# Patient Record
Sex: Female | Born: 1995 | Race: White | Hispanic: No | Marital: Married | Smoking: Former smoker
Health system: Southern US, Community
[De-identification: ages and names within clinical notes are randomized; demographics above are authoritative.]

## PROBLEM LIST (undated history)

## (undated) DIAGNOSIS — E559 Vitamin D deficiency, unspecified: Secondary | ICD-10-CM

## (undated) DIAGNOSIS — I10 Essential (primary) hypertension: Secondary | ICD-10-CM

## (undated) DIAGNOSIS — J45909 Unspecified asthma, uncomplicated: Secondary | ICD-10-CM

## (undated) DIAGNOSIS — M7989 Other specified soft tissue disorders: Secondary | ICD-10-CM

## (undated) DIAGNOSIS — F419 Anxiety disorder, unspecified: Secondary | ICD-10-CM

## (undated) DIAGNOSIS — R0602 Shortness of breath: Secondary | ICD-10-CM

## (undated) DIAGNOSIS — G4730 Sleep apnea, unspecified: Secondary | ICD-10-CM

## (undated) DIAGNOSIS — F329 Major depressive disorder, single episode, unspecified: Secondary | ICD-10-CM

## (undated) DIAGNOSIS — Z8739 Personal history of other diseases of the musculoskeletal system and connective tissue: Secondary | ICD-10-CM

## (undated) DIAGNOSIS — R51 Headache: Secondary | ICD-10-CM

## (undated) DIAGNOSIS — F32A Depression, unspecified: Secondary | ICD-10-CM

## (undated) DIAGNOSIS — E039 Hypothyroidism, unspecified: Secondary | ICD-10-CM

## (undated) DIAGNOSIS — G8929 Other chronic pain: Secondary | ICD-10-CM

## (undated) DIAGNOSIS — E282 Polycystic ovarian syndrome: Secondary | ICD-10-CM

## (undated) DIAGNOSIS — R519 Headache, unspecified: Secondary | ICD-10-CM

## (undated) DIAGNOSIS — M549 Dorsalgia, unspecified: Secondary | ICD-10-CM

## (undated) HISTORY — DX: Vitamin D deficiency, unspecified: E55.9

## (undated) HISTORY — DX: Depression, unspecified: F32.A

## (undated) HISTORY — DX: Shortness of breath: R06.02

## (undated) HISTORY — DX: Hypothyroidism, unspecified: E03.9

## (undated) HISTORY — DX: Personal history of other diseases of the musculoskeletal system and connective tissue: Z87.39

## (undated) HISTORY — DX: Other chronic pain: G89.29

## (undated) HISTORY — DX: Sleep apnea, unspecified: G47.30

## (undated) HISTORY — DX: Polycystic ovarian syndrome: E28.2

## (undated) HISTORY — DX: Other specified soft tissue disorders: M79.89

## (undated) HISTORY — DX: Dorsalgia, unspecified: M54.9

## (undated) HISTORY — PX: NO PAST SURGERIES: SHX2092

## (undated) HISTORY — DX: Major depressive disorder, single episode, unspecified: F32.9

## (undated) HISTORY — DX: Morbid (severe) obesity due to excess calories: E66.01

## (undated) HISTORY — DX: Unspecified asthma, uncomplicated: J45.909

## (undated) HISTORY — DX: Anxiety disorder, unspecified: F41.9

## (undated) HISTORY — DX: Headache, unspecified: R51.9

## (undated) HISTORY — DX: Essential (primary) hypertension: I10

## (undated) HISTORY — PX: OTHER SURGICAL HISTORY: SHX169

## (undated) HISTORY — DX: Headache: R51

---

## 2014-10-15 DIAGNOSIS — I10 Essential (primary) hypertension: Secondary | ICD-10-CM | POA: Insufficient documentation

## 2014-10-15 HISTORY — DX: Essential (primary) hypertension: I10

## 2014-12-22 DIAGNOSIS — K219 Gastro-esophageal reflux disease without esophagitis: Secondary | ICD-10-CM | POA: Insufficient documentation

## 2014-12-22 HISTORY — DX: Gastro-esophageal reflux disease without esophagitis: K21.9

## 2016-06-07 DIAGNOSIS — F322 Major depressive disorder, single episode, severe without psychotic features: Secondary | ICD-10-CM

## 2016-06-07 DIAGNOSIS — F419 Anxiety disorder, unspecified: Secondary | ICD-10-CM | POA: Insufficient documentation

## 2016-06-07 HISTORY — DX: Major depressive disorder, single episode, severe without psychotic features: F32.2

## 2018-01-29 ENCOUNTER — Other Ambulatory Visit: Payer: Self-pay | Admitting: Family

## 2018-01-29 ENCOUNTER — Ambulatory Visit (INDEPENDENT_AMBULATORY_CARE_PROVIDER_SITE_OTHER): Payer: PRIVATE HEALTH INSURANCE | Admitting: Family

## 2018-01-29 ENCOUNTER — Encounter: Payer: Self-pay | Admitting: Family

## 2018-01-29 VITALS — BP 128/73 | HR 79 | Temp 98.3°F | Resp 16 | Ht 68.0 in | Wt >= 6400 oz

## 2018-01-29 DIAGNOSIS — J4520 Mild intermittent asthma, uncomplicated: Secondary | ICD-10-CM

## 2018-01-29 DIAGNOSIS — Z6841 Body Mass Index (BMI) 40.0 and over, adult: Secondary | ICD-10-CM

## 2018-01-29 DIAGNOSIS — F32A Depression, unspecified: Secondary | ICD-10-CM

## 2018-01-29 DIAGNOSIS — E039 Hypothyroidism, unspecified: Secondary | ICD-10-CM

## 2018-01-29 DIAGNOSIS — F329 Major depressive disorder, single episode, unspecified: Secondary | ICD-10-CM

## 2018-01-29 DIAGNOSIS — R51 Headache: Secondary | ICD-10-CM

## 2018-01-29 DIAGNOSIS — G8929 Other chronic pain: Secondary | ICD-10-CM

## 2018-01-29 DIAGNOSIS — R350 Frequency of micturition: Secondary | ICD-10-CM

## 2018-01-29 DIAGNOSIS — G4733 Obstructive sleep apnea (adult) (pediatric): Secondary | ICD-10-CM | POA: Diagnosis not present

## 2018-01-29 LAB — URINALYSIS, ROUTINE W REFLEX MICROSCOPIC
BILIRUBIN URINE: NEGATIVE
KETONES UR: NEGATIVE
Leukocytes, UA: NEGATIVE
NITRITE: NEGATIVE
Specific Gravity, Urine: 1.02 (ref 1.000–1.030)
Urine Glucose: NEGATIVE
Urobilinogen, UA: 0.2 (ref 0.0–1.0)
pH: 6.5 (ref 5.0–8.0)

## 2018-01-29 LAB — HEMOGLOBIN A1C: HEMOGLOBIN A1C: 5.6 % (ref 4.6–6.5)

## 2018-01-29 LAB — BASIC METABOLIC PANEL
BUN: 8 mg/dL (ref 6–23)
CALCIUM: 9.1 mg/dL (ref 8.4–10.5)
CO2: 26 mEq/L (ref 19–32)
Chloride: 107 mEq/L (ref 96–112)
Creatinine, Ser: 0.56 mg/dL (ref 0.40–1.20)
GFR: 141.94 mL/min (ref >60.00)
Glucose, Bld: 97 mg/dL (ref 70–99)
POTASSIUM: 4.1 meq/L (ref 3.5–5.1)
Sodium: 140 mEq/L (ref 135–145)

## 2018-01-29 LAB — TSH: TSH: 2.54 u[IU]/mL (ref 0.35–4.50)

## 2018-01-29 LAB — T4, FREE: Free T4: 0.89 ng/dL (ref 0.60–1.60)

## 2018-01-29 LAB — T3, FREE: T3, Free: 4 pg/mL (ref 2.3–4.2)

## 2018-01-29 NOTE — Patient Instructions (Addendum)
You should be contacted about scheduling your appointment with neurology to further evaluate your headaches.

## 2018-01-29 NOTE — Telephone Encounter (Signed)
Lab work suggest urinary tract infection.  I would like her to take Macrodantin.  Also there was blood in her urine today.  Did she have her period today?  If not we will need to repeat her urinalysis with microscopic in 2 weeks please diagnosis hematuria.  Prescription has been pended.  Where would she like it sent?

## 2018-01-29 NOTE — Progress Notes (Signed)
Subjective:    Patient ID: Tiffany Serrano, female    DOB: [birth year], 22 y.o.   MRN: [id number]  HPI   Patient is a 22 yr old female who presents today to establish care.   Pmhx is significant for the following:  OSA-reports that she was diagnosed last year.  She uses CPAP and this is being manage [facility]  Hypothyroid- reports that she was on levothyroxine in the past.  Last February it was d/c'd because labs look better.   HTN- reports that she has never been treated with meds.   BP Readings from Last 3 Encounters:  01/29/18 128/73   Depression- reports that she lost her dad at age 5.  Mom remarried and she did not have a good relationship with her stepdad. 2017.  Was hospitalized at [facility] 2017 and treated with prozac which was discontinued May of last year.  Reports that mood has its ups and downs.  Feels well currently.   Chronic Headaches- occur 1-2 times a day. Last 30-60 minutes. Starts in the back of her neck and radiates up her head.  Uses excedrin prn. Has occasional associated dizziness. Denies associated nausea.  Reports good compliance with cpap.  Less headaches when she is off.    Asthma- she reports that she had asthma as a child. If she is around cigarette smoke.  Has an albuterol inhaler as needed.   Obesity- Reports that when she graduated high school she weighed about 350lbs.  Has tried multiple diets. She reports that she lost about 30 pounds but  Has a microwave and crockpot.  Has a hot plate  Wt Readings from Last 3 Encounters:  01/29/18 (!) 430 lb (195 kg)    Review of Systems  Constitutional: Negative for unexpected weight change.  HENT: Negative for hearing loss and rhinorrhea.   Eyes: Negative for visual disturbance.  Respiratory: Negative for cough.   Cardiovascular:       Has occasional LE edema  Gastrointestinal: Negative for constipation and diarrhea.  Genitourinary: Positive for frequency. Negative for dysuria.       Irregular  menses  Musculoskeletal: Negative for myalgias.       Some occasional right knee pain  Skin: Negative for rash.  Hematological: Negative for adenopathy.  Psychiatric/Behavioral:       Denies current depression symptoms.    Past Medical History:  Diagnosis Date  . Asthma   . Chronic headaches   . Depression   . Hypertension   . Hypothyroidism   . Sleep apnea      Social History   Socioeconomic History  . Marital status: Single    Spouse name: Not on file  . Number of children: Not on file  . Years of education: Not on file  . Highest education level: Not on file  Occupational History  . Occupation: Runner, broadcasting/film/video  . Financial resource strain: Not hard at all  . Food insecurity:    Worry: Never true    Inability: Never true  . Transportation needs:    Medical: No    Non-medical: No  Tobacco Use  . Smoking status: Former Smoker    Types: Cigarettes    Last attempt to quit: 01/29/2013    Years since quitting: 5.0  . Smokeless tobacco: Never Used  Substance and Sexual Activity  . Alcohol use: Yes    Comment: "some times"  . Drug use: Never  . Sexual activity: Yes    Partners: Male  Lifestyle  . Physical activity:    Days per week: 7 days    Minutes per session: 30 min  . Stress: Not on file  Relationships  . Social connections:    Talks on phone: More than three times a week    Gets together: Never    Attends religious service: More than 4 times per year    Active member of club or organization: No    Attends meetings of clubs or organizations: Never    Relationship status: Never married  . Intimate partner violence:    Fear of current or ex partner: Not on file    Emotionally abused: Not on file    Physically abused: Not on file    Forced sexual activity: Not on file  Other Topics Concern  . Not on file  Social History Narrative   Originally from [place], [place].   Truck driver (long distance)   Lives with her Mom and mom's fiance   Has a dog     Enjoys relaxing in her free time, spending time with family   Completed college    Past Surgical History:  Procedure Laterality Date  . none      Family History  Problem Relation Age of Onset  . Arthritis Mother   . Pancreatic cancer Father   . Anxiety disorder Sister   . Arthritis Maternal Grandmother   . Stroke Maternal Grandmother   . Arthritis Maternal Grandfather   . Arthritis Paternal Grandmother   . Arthritis Paternal Grandfather   . Heart attack Paternal Grandfather   . Hypertension Paternal Grandfather     No Known Allergies  Current Outpatient Medications on File Prior to Visit  Medication Sig Dispense Refill  . albuterol (PROVENTIL HFA;VENTOLIN HFA) 108 (90 Base) MCG/ACT inhaler Inhale 2 puffs into the lungs every 6 (six) hours as needed for wheezing or shortness of breath.     No current facility-administered medications on file prior to visit.     BP 128/73 (BP Location: Right Arm, Patient Position: Sitting, Cuff Size: Large)   Pulse 79   Temp 98.3 F (36.8 C) (Oral)   Resp 16   Ht 5\' 8"  (1.727 m)   Wt (!) 430 lb (195 kg)   LMP 01/15/2018   SpO2 100%   BMI 65.38 kg/m       Objective:   Physical Exam  Constitutional: She is oriented to person, place, and time. She appears well-developed. No distress.  Morbidly obese white female  HENT:  Head: Normocephalic and atraumatic.  Eyes: Pupils are equal, round, and reactive to light. Right eye exhibits no discharge. Left eye exhibits no discharge. No scleral icterus.  Neck: Neck supple. No thyromegaly present.  Cardiovascular: Normal rate and regular rhythm.  No murmur heard. Pulmonary/Chest: Effort normal. No stridor. No respiratory distress. She has no wheezes.  Musculoskeletal: She exhibits no edema.  Lymphadenopathy:    She has no cervical adenopathy.  Neurological: She is alert and oriented to person, place, and time. Coordination normal.  Skin: Skin is warm and dry.  + facial hair noted on  neck.   Psychiatric: She has a normal mood and affect. Her behavior is normal. Judgment and thought content normal.          Assessment & Plan:  Obstructive sleep apnea-patient reports good compliance with CPAP.  This is being managed by a sleep specialist.  Morbid obesity-we discussed healthy diet and exercise.  Hypothyroid-obtain follow-up thyroid function studies.  Clinically stable.  Chronic headaches-  uncontrolled.  Will refer to neurology for further evaluation.  Asthma- reports well-controlled typically only uses albuterol if she has bronchitis.  Monitor.  Depression-she reports that her depression symptoms are currently well controlled.  We will continue to monitor.

## 2018-01-30 ENCOUNTER — Encounter: Payer: Self-pay | Admitting: Neurology

## 2018-01-30 ENCOUNTER — Other Ambulatory Visit: Payer: Self-pay

## 2018-01-30 DIAGNOSIS — R350 Frequency of micturition: Secondary | ICD-10-CM | POA: Insufficient documentation

## 2018-01-30 DIAGNOSIS — G4733 Obstructive sleep apnea (adult) (pediatric): Secondary | ICD-10-CM | POA: Insufficient documentation

## 2018-01-30 DIAGNOSIS — J4520 Mild intermittent asthma, uncomplicated: Secondary | ICD-10-CM

## 2018-01-30 DIAGNOSIS — G8929 Other chronic pain: Secondary | ICD-10-CM | POA: Insufficient documentation

## 2018-01-30 DIAGNOSIS — E039 Hypothyroidism, unspecified: Secondary | ICD-10-CM | POA: Insufficient documentation

## 2018-01-30 DIAGNOSIS — Z6841 Body Mass Index (BMI) 40.0 and over, adult: Secondary | ICD-10-CM

## 2018-01-30 DIAGNOSIS — R519 Headache, unspecified: Secondary | ICD-10-CM | POA: Insufficient documentation

## 2018-01-30 DIAGNOSIS — F32A Depression, unspecified: Secondary | ICD-10-CM | POA: Insufficient documentation

## 2018-01-30 DIAGNOSIS — R51 Headache: Secondary | ICD-10-CM

## 2018-01-30 DIAGNOSIS — F329 Major depressive disorder, single episode, unspecified: Secondary | ICD-10-CM | POA: Insufficient documentation

## 2018-01-30 HISTORY — DX: Obstructive sleep apnea (adult) (pediatric): G47.33

## 2018-01-30 HISTORY — DX: Mild intermittent asthma, uncomplicated: J45.20

## 2018-01-30 MED ORDER — NITROFURANTOIN MACROCRYSTAL 100 MG PO CAPS: 100.0000 mg | ORAL_CAPSULE | Freq: Two times a day (BID) | ORAL | 0 refills | Status: DC

## 2018-01-30 NOTE — Telephone Encounter (Signed)
Results given to patient, she reports she did have her period during specimen collection. [place] [facility] [place] per Melissa not necessary to f/up on microhematuria.

## 2018-02-19 ENCOUNTER — Encounter: Payer: Self-pay | Admitting: Family

## 2018-04-09 NOTE — Progress Notes (Deleted)
NEUROLOGY CONSULTATION NOTE  Tiffany Serrano MRN: [id number] DOB: 1995/11/07  Referring provider: Sandford Craze, NP Primary care provider: Sandford Craze, NP  Reason for consult:  headache  HISTORY OF PRESENT ILLNESS: Tiffany Serrano is a 22 year old ***-handed female with depression, hypertension, hypothyroidism and sleep apnea who presents for headaches.  History supplemented by referring providers note.  Onset:  *** Location:  Starts in back of neck and radiates up to *** Quality:  *** Intensity:  ***.  *** denies new headache, thunderclap headache or severe headache that wakes *** from sleep. Aura:  *** Prodrome:  *** Postdrome:  *** Associated symptoms:  ***.  She denies associated nausea, vomiting, *** unilateral numbness or weakness. Duration:  30-60 minutes Frequency:  1 to 2 times daily Frequency of abortive medication: *** Triggers:  *** Exacerbating factors:  *** Relieving factors:  *** Activity:  ***  Current NSAIDS:  *** Current analgesics:  Excedrin Current triptans:  *** Current ergotamine:  *** Current anti-emetic:  *** Current muscle relaxants:  *** Current anti-anxiolytic:  *** Current sleep aide:  *** Current Antihypertensive medications:  *** Current Antidepressant medications:  *** Current Anticonvulsant medications:  *** Current anti-CGRP:  *** Current Vitamins/Herbal/Supplements:  *** Current Antihistamines/Decongestants:  *** Other therapy:  *** Other medication:  ***  Past NSAIDS:  *** Past analgesics:  *** Past abortive triptans:  *** Past abortive ergotamine:  *** Past muscle relaxants:  *** Past anti-emetic:  *** Past antihypertensive medications:  *** Past antidepressant medications:  *** Past anticonvulsant medications:  *** Past anti-CGRP:  *** Past vitamins/Herbal/Supplements:  *** Past antihistamines/decongestants:  *** Other past therapies:  ***  Caffeine:  *** Alcohol:  *** Smoker:  *** Diet:  *** Exercise:   *** Depression:  ***; Anxiety:  *** Other pain:  *** Sleep hygiene:  Good.  Has OSA but compliant with CPAP Family history of headache:  ***  01/29/18 LABS:  BMP with Na 140, K 4.1, CO2 26, glucose 97, BUN 8, Cr 0.56  PAST MEDICAL HISTORY: Past Medical History:  Diagnosis Date  . Asthma   . Chronic headaches   . Depression   . Hypertension   . Hypothyroidism   . Sleep apnea     PAST SURGICAL HISTORY: Past Surgical History:  Procedure Laterality Date  . none      MEDICATIONS: Current Outpatient Medications on File Prior to Visit  Medication Sig Dispense Refill  . albuterol (PROVENTIL HFA;VENTOLIN HFA) 108 (90 Base) MCG/ACT inhaler Inhale 2 puffs into the lungs every 6 (six) hours as needed for wheezing or shortness of breath.    . nitrofurantoin (MACRODANTIN) 100 MG capsule Take 1 capsule (100 mg total) by mouth 2 (two) times daily. 10 capsule 0   No current facility-administered medications on file prior to visit.     ALLERGIES: No Known Allergies  FAMILY HISTORY: Family History  Problem Relation Age of Onset  . Arthritis Mother   . Pancreatic cancer Father   . Anxiety disorder Sister   . Arthritis Maternal Grandmother   . Stroke Maternal Grandmother   . Arthritis Maternal Grandfather   . Arthritis Paternal Grandmother   . Arthritis Paternal Grandfather   . Heart attack Paternal Grandfather   . Hypertension Paternal Grandfather    ***.  SOCIAL HISTORY: Social History   Socioeconomic History  . Marital status: Single    Spouse name: Not on file  . Number of children: Not on file  . Years of education: Not on file  .  Highest education level: Not on file  Occupational History  . Occupation: Runner, broadcasting/film/video  . Financial resource strain: Not hard at all  . Food insecurity:    Worry: Never true    Inability: Never true  . Transportation needs:    Medical: No    Non-medical: No  Tobacco Use  . Smoking status: Former Smoker    Types: Cigarettes     Last attempt to quit: 01/29/2013    Years since quitting: 5.1  . Smokeless tobacco: Never Used  Substance and Sexual Activity  . Alcohol use: Yes    Comment: "some times"  . Drug use: Never  . Sexual activity: Yes    Partners: Male  Lifestyle  . Physical activity:    Days per week: 7 days    Minutes per session: 30 min  . Stress: Not on file  Relationships  . Social connections:    Talks on phone: More than three times a week    Gets together: Never    Attends religious service: More than 4 times per year    Active member of club or organization: No    Attends meetings of clubs or organizations: Never    Relationship status: Never married  . Intimate partner violence:    Fear of current or ex partner: Not on file    Emotionally abused: Not on file    Physically abused: Not on file    Forced sexual activity: Not on file  Other Topics Concern  . Not on file  Social History Narrative   Originally from [place], [place].   Truck driver (long distance)   Lives with her Mom and mom's fiance   Has a dog   Enjoys relaxing in her free time, spending time with family   Completed college    REVIEW OF SYSTEMS: Constitutional: No fevers, chills, or sweats, no generalized fatigue, change in appetite Eyes: No visual changes, double vision, eye pain Ear, nose and throat: No hearing loss, ear pain, nasal congestion, sore throat Cardiovascular: No chest pain, palpitations Respiratory:  No shortness of breath at rest or with exertion, wheezes GastrointestinaI: No nausea, vomiting, diarrhea, abdominal pain, fecal incontinence Genitourinary:  No dysuria, urinary retention or frequency Musculoskeletal:  No neck pain, back pain Integumentary: No rash, pruritus, skin lesions Neurological: as above Psychiatric: No depression, insomnia, anxiety Endocrine: No palpitations, fatigue, diaphoresis, mood swings, change in appetite, change in weight, increased thirst Hematologic/Lymphatic:  No  purpura, petechiae. Allergic/Immunologic: no itchy/runny eyes, nasal congestion, recent allergic reactions, rashes  PHYSICAL EXAM: *** General: No acute distress.  Patient appears ***-groomed.  *** Head:  Normocephalic/atraumatic Eyes:  fundi examined but not visualized Neck: supple, no paraspinal tenderness, full range of motion Back: No paraspinal tenderness Heart: regular rate and rhythm Lungs: Clear to auscultation bilaterally. Vascular: No carotid bruits. Neurological Exam: Mental status: alert and oriented to person, place, and time, recent and remote memory intact, fund of knowledge intact, attention and concentration intact, speech fluent and not dysarthric, language intact. Cranial nerves: CN I: not tested CN II: pupils equal, round and reactive to light, visual fields intact CN III, IV, VI:  full range of motion, no nystagmus, no ptosis CN V: facial sensation intact CN VII: upper and lower face symmetric CN VIII: hearing intact CN IX, X: gag intact, uvula midline CN XI: sternocleidomastoid and trapezius muscles intact CN XII: tongue midline Bulk & Tone: normal, no fasciculations. Motor:  5/5 throughout *** Sensation:  Pinprick *** temperature ***  and vibration sensation intact.  ***. Deep Tendon Reflexes:  2+ throughout, *** toes downgoing.  *** Finger to nose testing:  Without dysmetria.  *** Heel to shin:  Without dysmetria.  *** Gait:  Normal station and stride.  Able to turn and tandem walk. Romberg ***.  IMPRESSION: ***  PLAN: ***  Thank you for allowing me to take part in the care of this patient.  Shon MilletAdam Aundrey Elahi, DO  CC: ***

## 2018-04-10 ENCOUNTER — Ambulatory Visit: Payer: PRIVATE HEALTH INSURANCE | Admitting: Family

## 2018-04-10 ENCOUNTER — Ambulatory Visit: Payer: PRIVATE HEALTH INSURANCE | Admitting: Neurology

## 2018-04-12 ENCOUNTER — Ambulatory Visit: Payer: PRIVATE HEALTH INSURANCE | Admitting: Family

## 2021-11-04 ENCOUNTER — Other Ambulatory Visit: Payer: Self-pay

## 2021-11-04 DIAGNOSIS — R1900 Intra-abdominal and pelvic swelling, mass and lump, unspecified site: Secondary | ICD-10-CM

## 2021-11-07 ENCOUNTER — Other Ambulatory Visit: Payer: Self-pay

## 2021-11-07 DIAGNOSIS — R1900 Intra-abdominal and pelvic swelling, mass and lump, unspecified site: Secondary | ICD-10-CM

## 2021-11-15 ENCOUNTER — Encounter (INDEPENDENT_AMBULATORY_CARE_PROVIDER_SITE_OTHER): Payer: Self-pay

## 2021-11-15 DIAGNOSIS — Z0289 Encounter for other administrative examinations: Secondary | ICD-10-CM

## 2021-11-23 ENCOUNTER — Ambulatory Visit
Admission: RE | Admit: 2021-11-23 | Discharge: 2021-11-23 | Disposition: A | Discharge status: Home / Self Care | Payer: BC Managed Care – PPO | Source: Ambulatory Visit

## 2021-11-23 ENCOUNTER — Other Ambulatory Visit: Payer: Self-pay

## 2021-11-23 ENCOUNTER — Ambulatory Visit
Admission: RE | Admit: 2021-11-23 | Discharge: 2021-11-23 | Disposition: A | Discharge status: Home / Self Care | Payer: PRIVATE HEALTH INSURANCE | Source: Ambulatory Visit

## 2021-11-23 DIAGNOSIS — R1900 Intra-abdominal and pelvic swelling, mass and lump, unspecified site: Secondary | ICD-10-CM

## 2021-11-23 IMAGING — MR MR ABDOMEN W/O CM
5 series · 33 of 48 positions shown · non-contrast
Comparison: Noncontrast CT on [DATE]

CLINICAL DATA: Cystic abdominal and pelvic mass on recent CT.

EXAM:
MRI ABDOMEN AND PELVIS WITHOUT CONTRAST
TECHNIQUE: Multiplanar multisequence MR imaging of the abdomen and pelvis was
performed. No intravenous contrast was administered. Exam was
technically limited by patient's large habitus and inability to
complete all exam sequences.

[Series 5: T2 · coronal · 5.0mm · 1.95mm/px · 6 of 42 slices shown (1 of 3)]
[im 1/42]
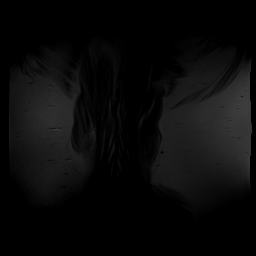
[im 9/42]
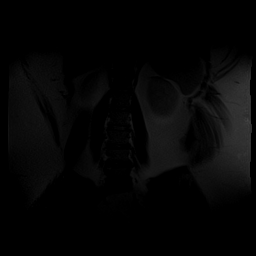
[im 17/42]
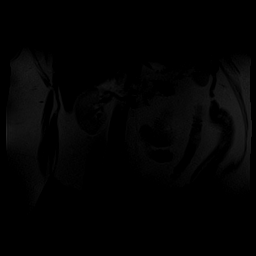
[im 25/42]
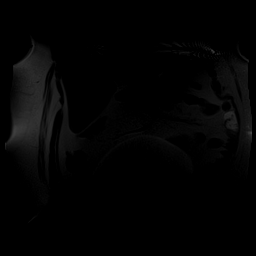
[im 33/42]
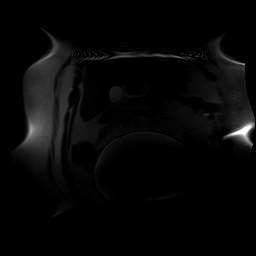
[im 42/42]
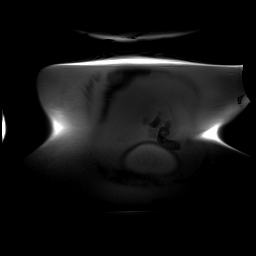

[Series 6: T1 · axial · 4.0mm · 1.38mm/px · z∈[-264,-4]mm · 11 of 144 slices shown]
[im 7/144]
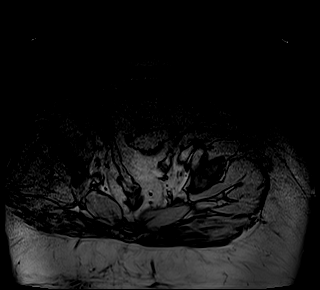
[im 20/144]
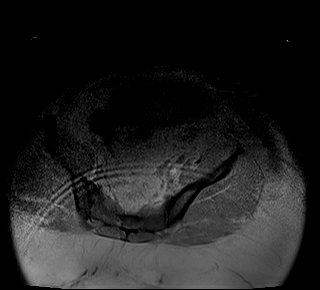
[im 27/144]
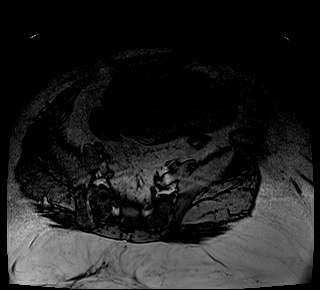
[im 46/144]
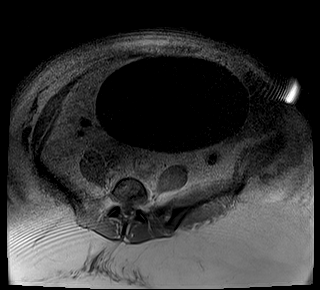
[im 66/144]
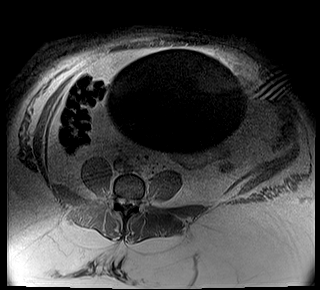
[im 72/144]
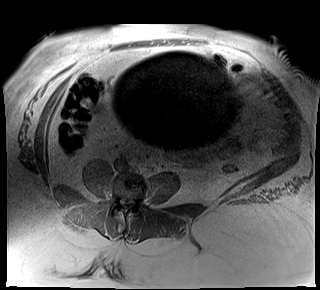
[im 79/144]
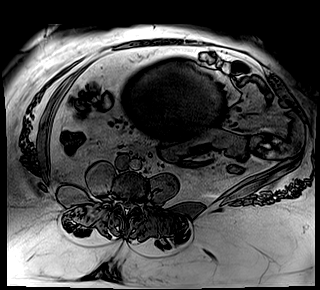
[im 98/144]
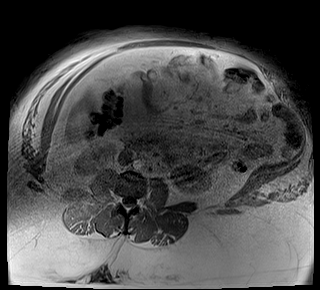
[im 118/144]
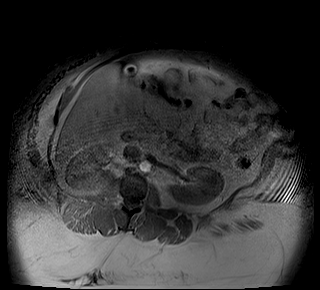
[im 124/144]
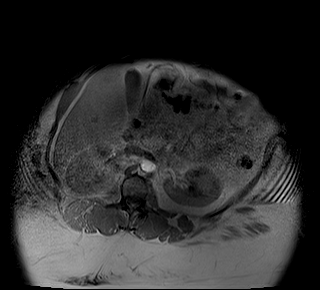
[im 137/144]
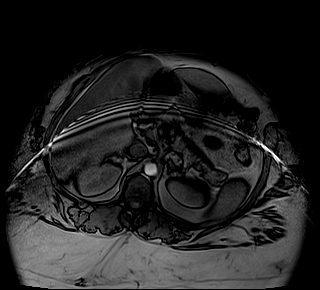

[Series 10: T2 · coronal · 6.0mm · 1.56mm/px · 5 of 33 slices shown (2 of 3)]
[im 1/33]
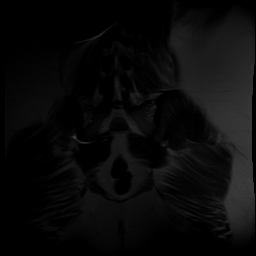
[im 9/33]
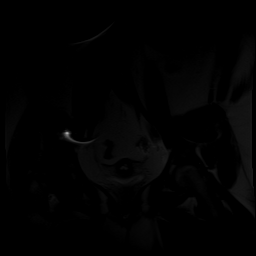
[im 17/33]
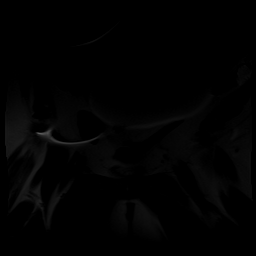
[im 25/33]
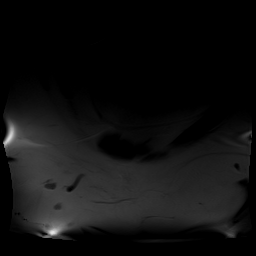
[im 33/33]
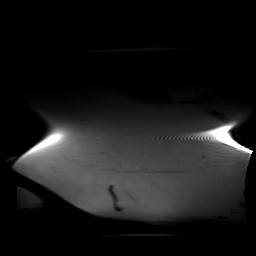

[Series 11: T2 · axial · 5.5mm · 0.70mm/px · z∈[-36,+241]mm · 7 of 43 slices shown (3 of 3)]
[im 1/43]
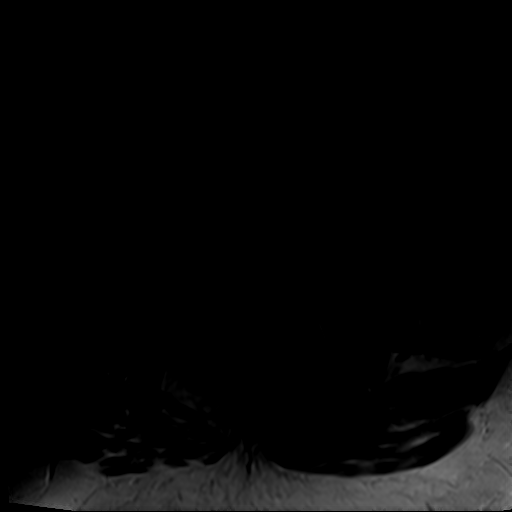
[im 8/43]
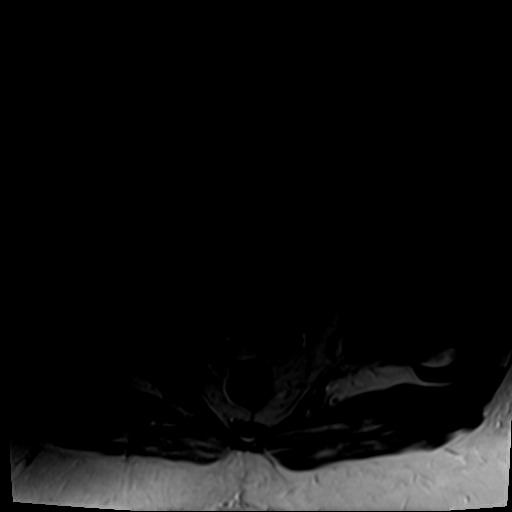
[im 15/43]
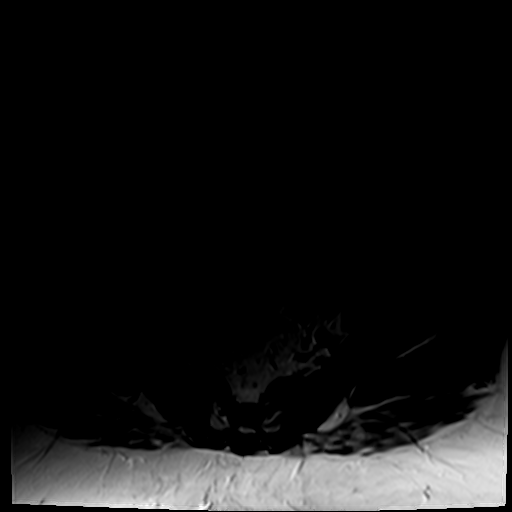
[im 22/43]
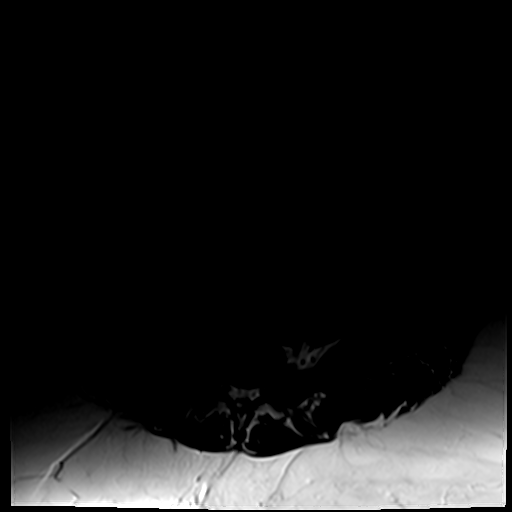
[im 29/43]
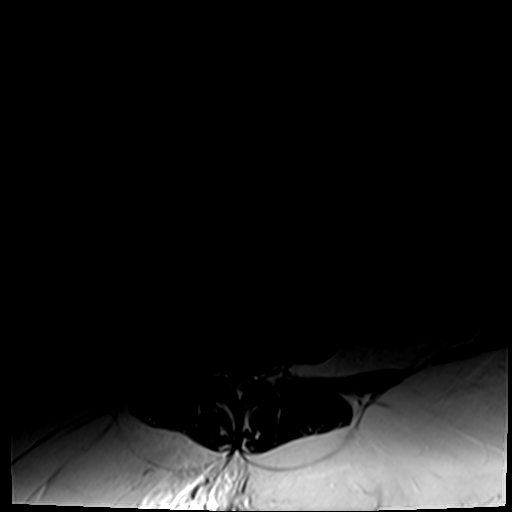
[im 36/43]
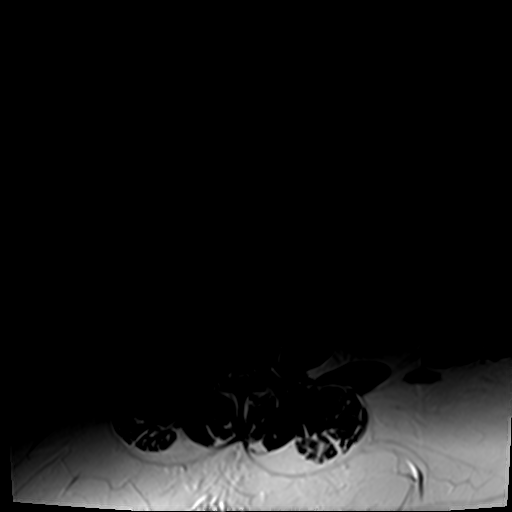
[im 43/43]
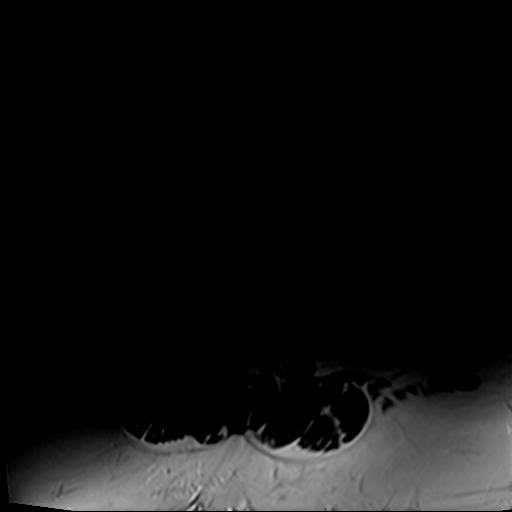

[Series 12: T2 fat-sat · axial · 5.5mm · 0.70mm/px · z∈[-36,+103]mm · 4 of 43 slices shown]
[im 1/43]
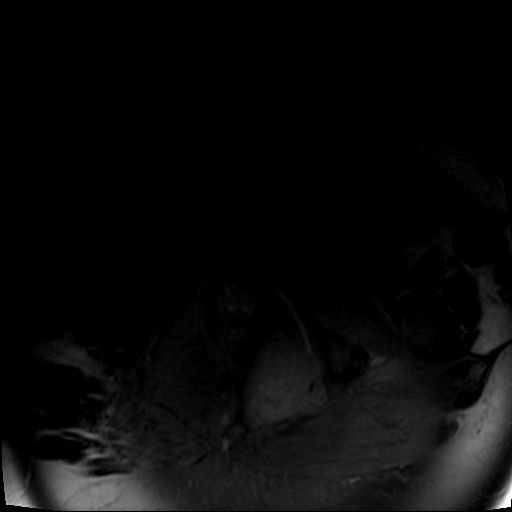
[im 8/43]
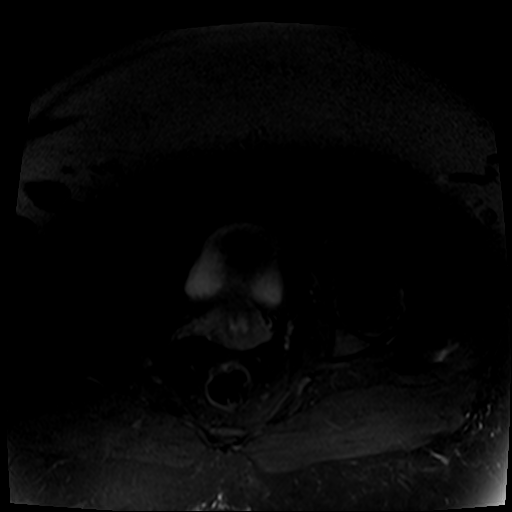
[im 15/43]
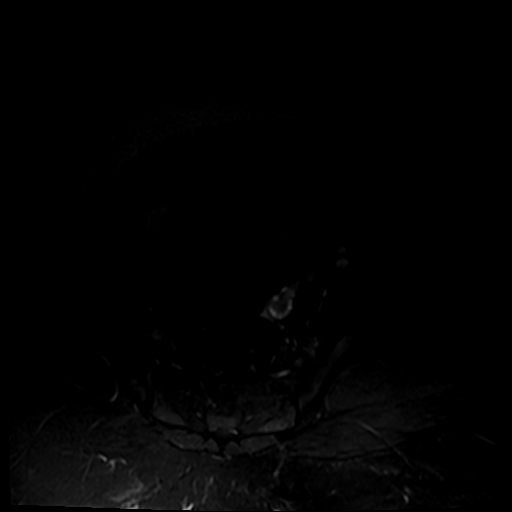
[im 22/43]
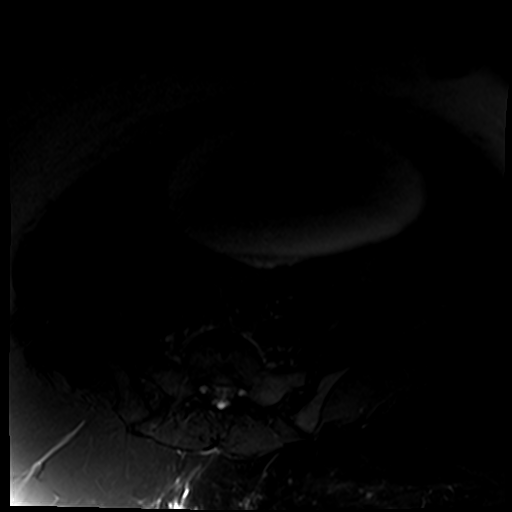

[33 of 48 positions shown; findings below may reference images not displayed]

FINDINGS: COMBINED FINDINGS FOR BOTH MR ABDOMEN AND PELVIS

Hepatobiliary:  No mass visualized on this unenhanced exam.

Pancreas: No mass or inflammatory process visualized on this
unenhanced exam.

Spleen:  Within normal limits in size.

Adrenals/Urinary tract: No evidence of urolithiasis or
hydronephrosis. Unremarkable unopacified urinary bladder.

Stomach/Bowel: No evidence of obstruction, inflammatory process, or
abnormal fluid collections.

Vascular/Lymphatic: No pathologically enlarged lymph nodes
identified. No evidence of abdominal aortic aneurysm.

Reproductive: The uterus is normal in size and appearance. The left
ovary is normal. A large simple appearing cystic lesion is seen in
the central upper pelvis and lower abdomen, which appears to arise
from the right ovary is normal ovarian tissue is seen along the
posterior margin of this lesion. This lesion measures 20.9 x 14.5 x
14.6 cm, and shows no evidence of internal septations, mural
nodules, or other solid component. This is highly suspicious for
ovarian serous cystadenoma. No other pelvic masses identified. No
evidence of free fluid.

Other:  None.

Musculoskeletal:  No suspicious bone lesions identified.
IMPRESSION: Technically limited exam. 21 cm simple appearing cystic lesion in
the central upper pelvis and lower abdomen, which appears to arise
from the right ovary. This has benign characteristics, and is highly
suspicious for ovarian serous cystadenoma. Surgical removal should
be considered.

## 2021-11-23 IMAGING — MR MR PELVIS W/O CM
3 series · 25 of 48 positions shown · non-contrast
Comparison: CT abdomen/pelvis dated [DATE]

CLINICAL DATA: Abdominopelvic mass on CT

EXAM:
MRI PELVIS WITHOUT CONTRAST
TECHNIQUE: Multiplanar multisequence MR imaging of the pelvis was performed. No
intravenous contrast was administered.

[Series 9: T2 · coronal · 6.0mm · 1.56mm/px · 8 of 33 slices shown (1 of 2)]
[im 1/33]
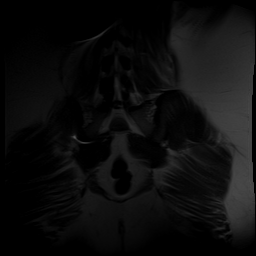
[im 5/33]
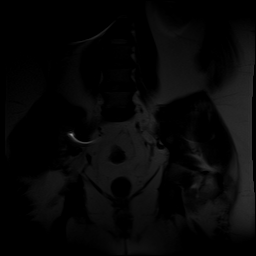
[im 10/33]
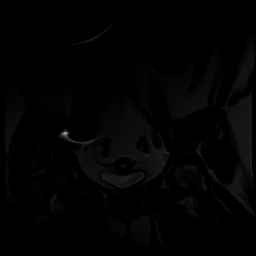
[im 15/33]
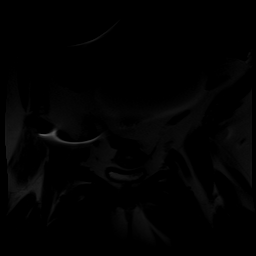
[im 18/33]
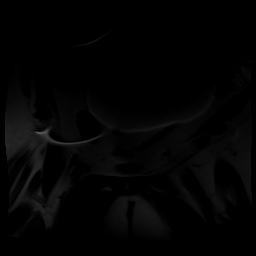
[im 23/33]
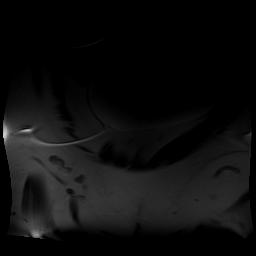
[im 28/33]
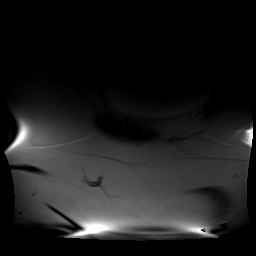
[im 33/33]
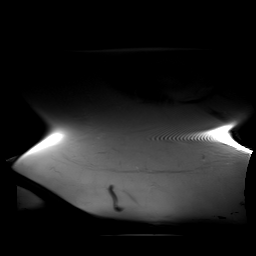

[Series 10: T2 · axial · 5.5mm · 0.70mm/px · z∈[-23,+221]mm · 11 of 43 slices shown (2 of 2)]
[im 3/43]
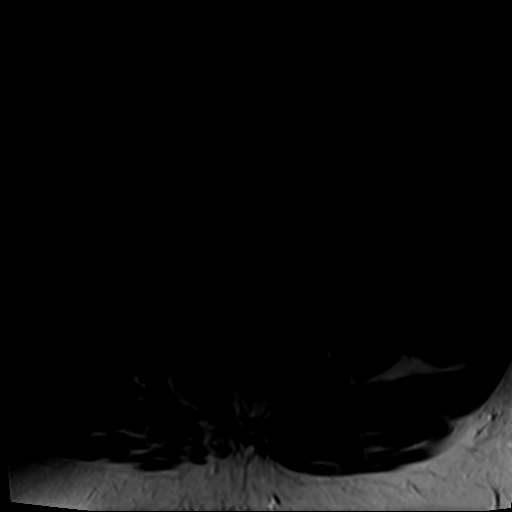
[im 6/43]
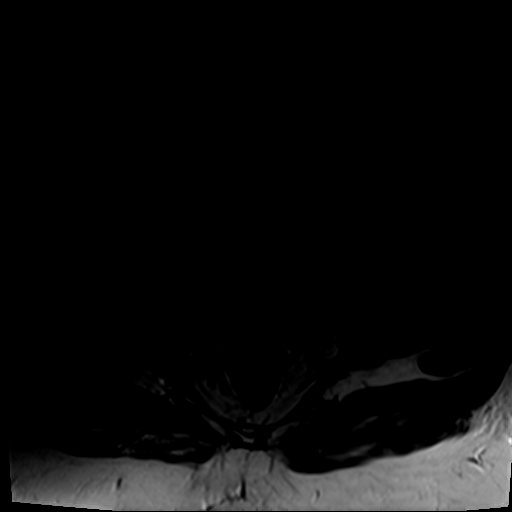
[im 8/43]
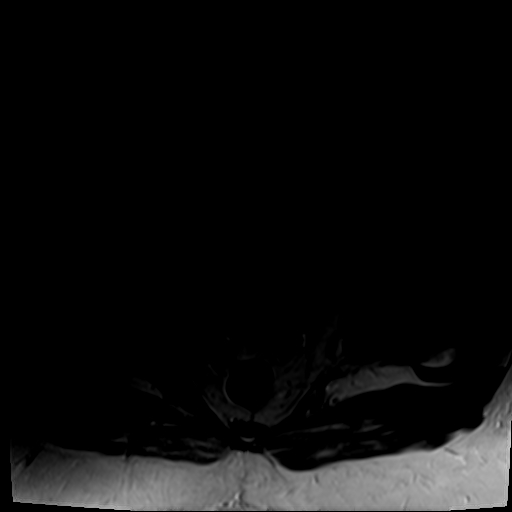
[im 14/43]
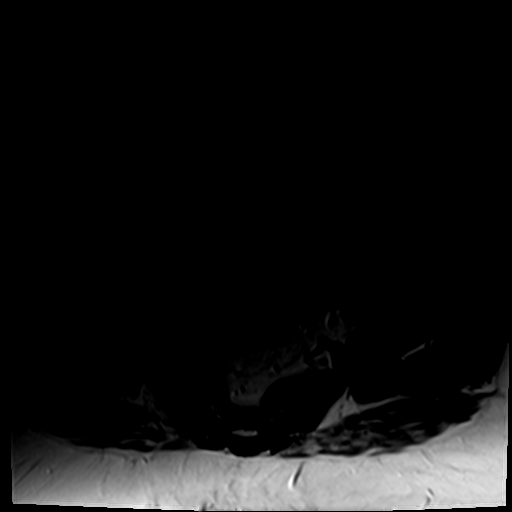
[im 19/43]
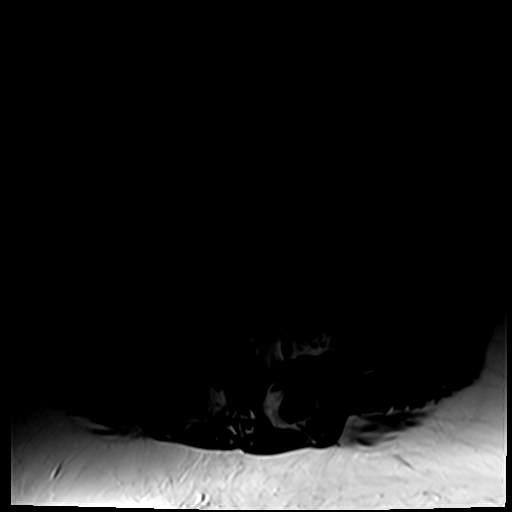
[im 22/43]
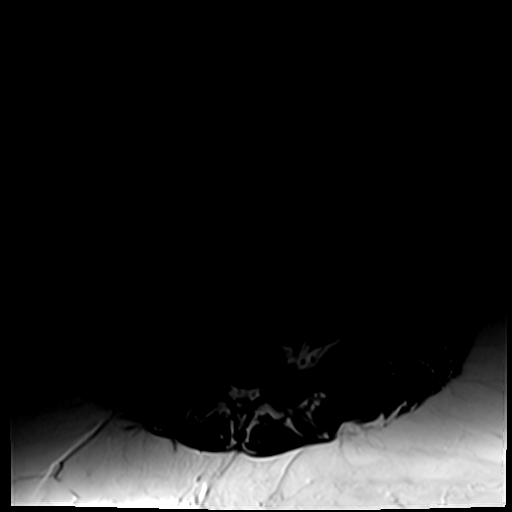
[im 24/43]
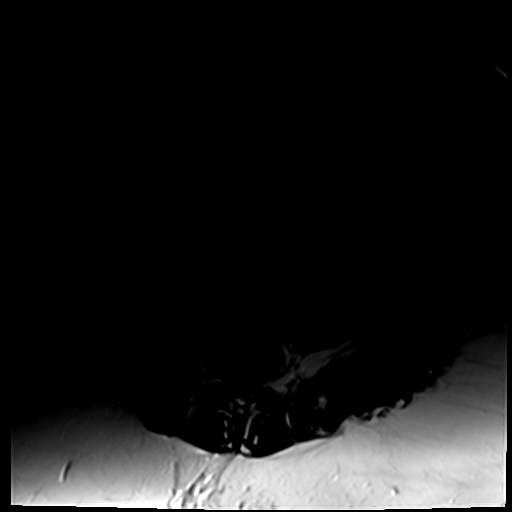
[im 29/43]
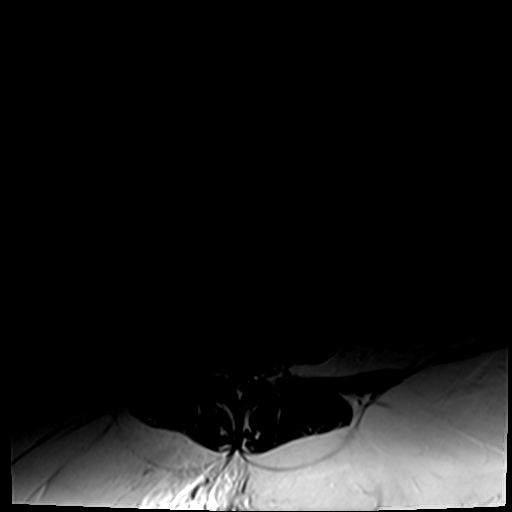
[im 35/43]
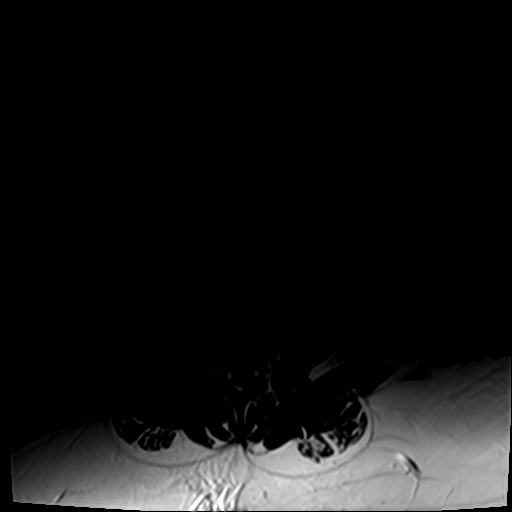
[im 37/43]
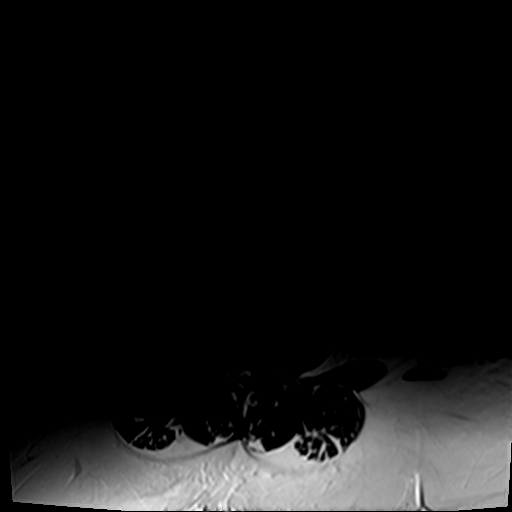
[im 40/43]
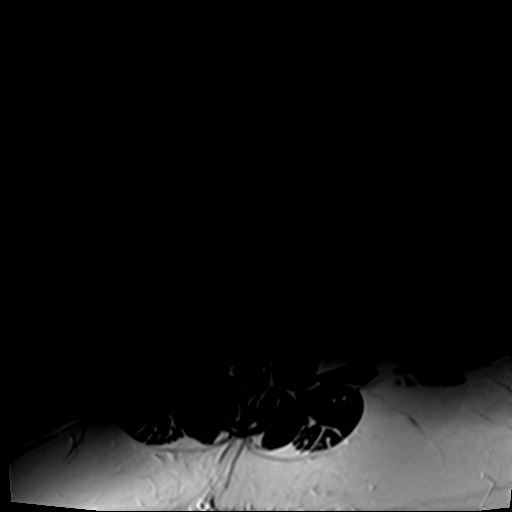

[Series 11: T2 fat-sat · axial · 5.5mm · 0.70mm/px · z∈[-23,+202]mm · 6 of 43 slices shown]
[im 3/43]
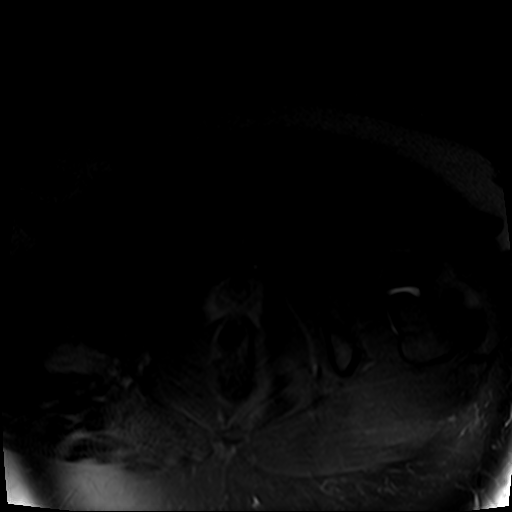
[im 6/43]
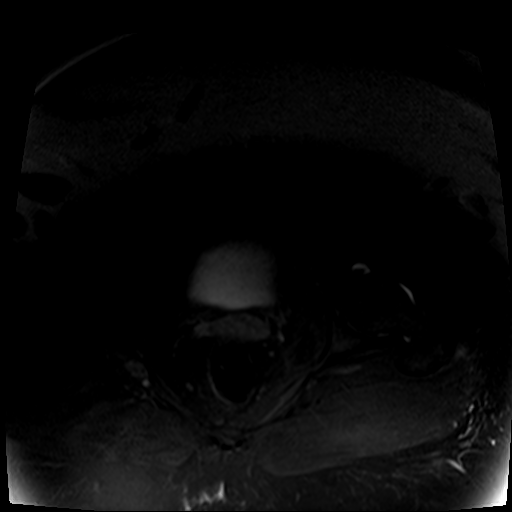
[im 8/43]
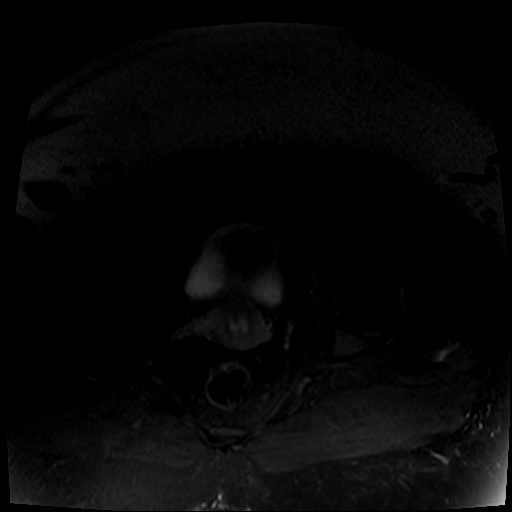
[im 14/43]
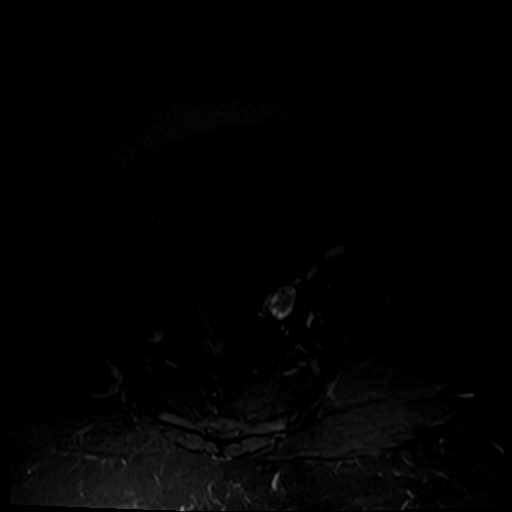
[im 22/43]
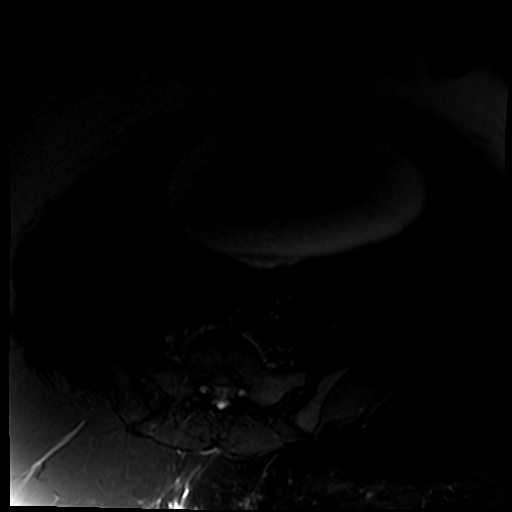
[im 37/43]
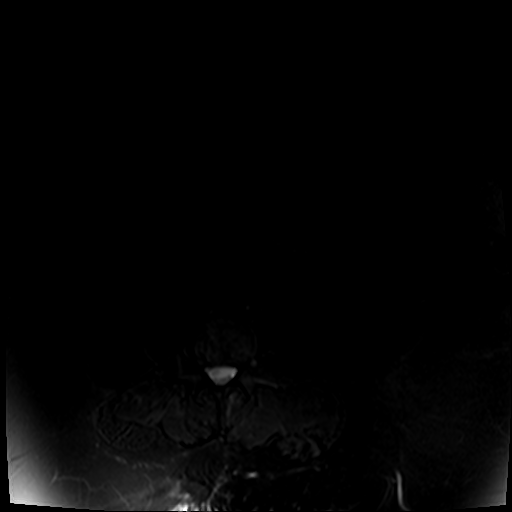

[25 of 48 positions shown; findings below may reference images not displayed]

FINDINGS: Markedly limited evaluation. Only 3 series were performed (coronal
T2, axial T2, and axial T2 fat sat) due to patient's inability to
tolerate the study. Postcontrast imaging of the pelvis was not
performed. Additionally, imaging of the abdomen was not performed.

Urinary Tract:  Bladder is within normal limits.

Bowel:  Visualized bowel is grossly unremarkable.

Vascular/Lymphatic: No evidence of aneurysm.

No suspicious pelvic lymphadenopathy.

Reproductive:  Uterus and left ovary is within normal limits.

Right ovary is not discretely visualized. However, there is a 13.6 x
21.4 x 15.7 cm cystic mass without convincing mural nodularity or
septation in the left anterior lower abdomen (series 11/image 17)
which is favored to arise from the right ovary/adnexa.

Other:  No pelvic ascites.

Musculoskeletal: No focal osseous lesion.
IMPRESSION: Markedly limited evaluation, as described above.

21.4 cystic mass in the left anterior lower abdomen, favored to
arise from the right ovary/adnexa, suspicious for ovarian neoplasm.
No overt malignant features. GYN surgical consultation is suggested.

## 2021-12-29 ENCOUNTER — Encounter (INDEPENDENT_AMBULATORY_CARE_PROVIDER_SITE_OTHER): Payer: Self-pay | Admitting: Family Medicine

## 2021-12-29 ENCOUNTER — Ambulatory Visit (INDEPENDENT_AMBULATORY_CARE_PROVIDER_SITE_OTHER): Payer: BC Managed Care – PPO | Admitting: Family Medicine

## 2021-12-29 VITALS — BP 139/85 | HR 73 | Temp 98.3°F | Ht 64.0 in | Wt >= 6400 oz

## 2021-12-29 DIAGNOSIS — E282 Polycystic ovarian syndrome: Secondary | ICD-10-CM | POA: Diagnosis not present

## 2021-12-29 DIAGNOSIS — Z87891 Personal history of nicotine dependence: Secondary | ICD-10-CM

## 2021-12-29 DIAGNOSIS — Z6841 Body Mass Index (BMI) 40.0 and over, adult: Secondary | ICD-10-CM

## 2021-12-29 DIAGNOSIS — R5383 Other fatigue: Secondary | ICD-10-CM

## 2021-12-29 DIAGNOSIS — R7303 Prediabetes: Secondary | ICD-10-CM | POA: Diagnosis not present

## 2021-12-29 DIAGNOSIS — E559 Vitamin D deficiency, unspecified: Secondary | ICD-10-CM | POA: Diagnosis not present

## 2021-12-29 DIAGNOSIS — R0602 Shortness of breath: Secondary | ICD-10-CM | POA: Diagnosis not present

## 2021-12-29 DIAGNOSIS — E039 Hypothyroidism, unspecified: Secondary | ICD-10-CM

## 2021-12-29 DIAGNOSIS — F32A Depression, unspecified: Secondary | ICD-10-CM | POA: Diagnosis not present

## 2021-12-29 DIAGNOSIS — F419 Anxiety disorder, unspecified: Secondary | ICD-10-CM | POA: Diagnosis not present

## 2021-12-29 DIAGNOSIS — G4733 Obstructive sleep apnea (adult) (pediatric): Secondary | ICD-10-CM | POA: Diagnosis not present

## 2021-12-29 DIAGNOSIS — I10 Essential (primary) hypertension: Secondary | ICD-10-CM

## 2021-12-29 MED ORDER — SERTRALINE HCL 50 MG PO TABS: 50.0000 mg | ORAL_TABLET | Freq: Every day | ORAL | 0 refills | Status: DC
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
## 2021-12-30 LAB — COMPREHENSIVE METABOLIC PANEL
ALT: 31 IU/L (ref 0–32)
AST: 28 IU/L (ref 0–40)
Albumin/Globulin Ratio: 1.2 (ref 1.2–2.2)
Albumin: 4.2 g/dL (ref 3.9–5.0)
Alkaline Phosphatase: 86 IU/L (ref 44–121)
BUN/Creatinine Ratio: 14 (ref 9–23)
BUN: 9 mg/dL (ref 6–20)
Bilirubin Total: 0.4 mg/dL (ref 0.0–1.2)
CO2: 22 mmol/L (ref 20–29)
Calcium: 9.6 mg/dL (ref 8.7–10.2)
Chloride: 99 mmol/L (ref 96–106)
Creatinine, Ser: 0.63 mg/dL (ref 0.57–1.00)
Globulin, Total: 3.4 g/dL (ref 1.5–4.5)
Glucose: 100 mg/dL — ABNORMAL HIGH (ref 70–99)
Potassium: 4.5 mmol/L (ref 3.5–5.2)
Sodium: 140 mmol/L (ref 134–144)
Total Protein: 7.6 g/dL (ref 6.0–8.5)
eGFR: 125 mL/min/{1.73_m2} (ref >59)

## 2021-12-30 LAB — VITAMIN D 25 HYDROXY (VIT D DEFICIENCY, FRACTURES): Vit D, 25-Hydroxy: 35.7 ng/mL (ref 30.0–100.0)

## 2021-12-30 LAB — LIPID PANEL WITH LDL/HDL RATIO
Cholesterol, Total: 143 mg/dL (ref 100–199)
HDL: 48 mg/dL (ref >39)
LDL Chol Calc (NIH): 77 mg/dL (ref 0–99)
LDL/HDL Ratio: 1.6 ratio (ref 0.0–3.2)
Triglycerides: 99 mg/dL (ref 0–149)
VLDL Cholesterol Cal: 18 mg/dL (ref 5–40)

## 2021-12-30 LAB — T4, FREE: Free T4: 1.46 ng/dL (ref 0.82–1.77)

## 2021-12-30 LAB — FOLATE: Folate: 7.8 ng/mL (ref >3.0)

## 2021-12-30 LAB — T3: T3, Total: 156 ng/dL (ref 71–180)

## 2021-12-30 LAB — TSH: TSH: 2.65 u[IU]/mL (ref 0.450–4.500)

## 2021-12-30 LAB — HEMOGLOBIN A1C
Est. average glucose Bld gHb Est-mCnc: 117 mg/dL
Hgb A1c MFr Bld: 5.7 % — ABNORMAL HIGH (ref 4.8–5.6)

## 2021-12-30 LAB — VITAMIN B12: Vitamin B-12: 510 pg/mL (ref 232–1245)

## 2021-12-30 LAB — INSULIN, RANDOM: INSULIN: 77.5 u[IU]/mL — ABNORMAL HIGH (ref 2.6–24.9)

## 2022-01-02 ENCOUNTER — Telehealth (INDEPENDENT_AMBULATORY_CARE_PROVIDER_SITE_OTHER): Payer: BC Managed Care – PPO | Admitting: Psychology

## 2022-01-02 DIAGNOSIS — F5089 Other specified eating disorder: Secondary | ICD-10-CM

## 2022-01-02 DIAGNOSIS — F330 Major depressive disorder, recurrent, mild: Secondary | ICD-10-CM

## 2022-01-02 NOTE — Telephone Encounter (Signed)
Please advise 

## 2022-01-02 NOTE — Telephone Encounter (Signed)
Dr.Ukleja 

## 2022-01-02 NOTE — Progress Notes (Signed)
Chief Complaint:   OBESITY Tiffany Serrano (MR# [id number]) is a 26 y.o. female who presents for evaluation and treatment of obesity and related comorbidities. Current BMI is Body mass index is 85.48 kg/m. Tiffany Serrano has been struggling with her weight for many years and has been unsuccessful in either losing weight, maintaining weight loss, or reaching her healthy weight goal.  Tiffany Serrano was referred by Tiffany Carney, NP.  She eats pop tarts in the morning (cookies and cream or strawberry), with water (feels satisfied).  1-2 hours later, sandwich with [place] or ham, 3 slices, cheese, mayo, pickles (satisfied).  Dinner is around 7-8 PM, chicken, 6 ounces with [place] or broccoli, or mac & cheese 1 cup (feels full).  May have a small granola bar.  Tiffany Serrano is currently in the action stage of change and ready to dedicate time achieving and maintaining a healthier weight. Tiffany Serrano is interested in becoming our patient and working on intensive lifestyle modifications including (but not limited to) diet and exercise for weight loss.  Tiffany Serrano's habits were reviewed today and are as follows: Her family eats meals together, her desired weight loss is 298 lbs, she has been heavy most of her life, she started gaining weight around elementary school, her heaviest weight ever was 511 pounds, she has significant food cravings issues, she snacks frequently in the evenings, she skips meals frequently, she is frequently drinking liquids with calories, she frequently makes poor food choices, she frequently eats larger portions than normal, and she struggles with emotional eating.  Depression Screen Tiffany Serrano's Food and Mood (modified PHQ-9) score was 23.     12/29/2021    9:18 AM  Depression screen PHQ 2/9  Decreased Interest 2  Down, Depressed, Hopeless 3  PHQ - 2 Score 5  Altered sleeping 3  Tired, decreased energy 3  Change in appetite 3  Feeling bad or failure about yourself  3  Trouble concentrating 2  Moving  slowly or fidgety/restless 2  Suicidal thoughts 2  PHQ-9 Score 23  Difficult doing work/chores Extremely dIfficult   Subjective:   1. Other fatigue Tiffany Serrano admits to daytime somnolence and admits to waking up still tired. Patient has a history of symptoms of daytime fatigue, morning fatigue, and morning headache. Tiffany Serrano generally gets 4 or 6 hours of sleep per night, and states that she has nightime awakenings. Snoring is present. Apneic episodes are present. Epworth Sleepiness Score is 14.  EKG-sinus tachycardia at 106 bpm.  2. SOBOE (shortness of breath on exertion) Tiffany Serrano notes increasing shortness of breath with exercising and seems to be worsening over time with weight gain. She notes getting out of breath sooner with activity than she used to. This has not gotten worse recently. Tiffany Serrano denies shortness of breath at rest or orthopnea.  3. PCOS (polycystic ovarian syndrome) Tiffany Serrano has a historical diagnosis.  Her testosterone level has been elevated in the past.  4. Pre-diabetes Tiffany Serrano's last A1c was 5.8, and she is not on medications.  5. Vitamin D deficiency Tiffany Serrano was on vitamin D weekly previously.  Her last vitamin D level was 9, and she notes fatigue.  6. OSA (obstructive sleep apnea) Tiffany Serrano has CPAP.  She is not wearing her CPAP because Tiffany Serrano has not sent a replacement after the recall.  7. Hypertension, unspecified type Tiffany Serrano blood pressure is liable.  She was on medications in the past; HCTZ.  8. Hypothyroidism, unspecified type Tiffany Serrano was diagnosed and was on medications previously.  Her last TSH was  normal per the patient.  9. Anxiety and depression Tiffany Serrano is not taking any medications.  She tried Lexapro, but it did not feel well managed; on Prozac in 2018 and she did have some symptom control.  She emotionally eats almost daily; she denies suicidal or homicidal ideations.  Assessment/Plan:   1. Other fatigue Tiffany Serrano does feel that her weight is causing her  energy to be lower than it should be. Fatigue may be related to obesity, depression or many other causes. Labs will be ordered, and in the meanwhile, Tiffany Serrano will focus on self care including making healthy food choices, increasing physical activity and focusing on stress reduction.  - EKG 12-Lead - Vitamin B12 - Folate  2. SOBOE (shortness of breath on exertion) Tiffany Serrano does feel that she gets out of breath more easily that she used to when she exercises. Tiffany Serrano shortness of breath appears to be obesity related and exercise induced. She has agreed to work on weight loss and gradually increase exercise to treat her exercise induced shortness of breath. Will continue to monitor closely.  3. PCOS (polycystic ovarian syndrome) We will follow-up on Tiffany Serrano's A1c and insulin level at her next appointment.  4. Pre-diabetes We will check labs today, and we will follow-up at Tiffany Serrano's next appointment.  - Hemoglobin A1c - Insulin, random  5. Vitamin D deficiency We will check labs today, and we will follow-up at Tiffany Serrano's next appointment.  - VITAMIN D 25 Hydroxy (Vit-D Deficiency, Fractures)  6. OSA (obstructive sleep apnea) We will refer Tiffany Serrano to Tiffany Serrano for obstructive sleep apnea management.  - Ambulatory referral to Neurology  7. Hypertension, unspecified type We will check labs today, and we will follow-up on Tiffany Serrano blood pressure and labs at her next appointment.  - Comprehensive metabolic panel - Lipid Panel With LDL/HDL Ratio  8. Hypothyroidism, unspecified type We will check labs today, and we will follow-up at Tiffany Serrano next appointment.  - T3 - T4, free - TSH  9. Anxiety and depression Tiffany Serrano agreed to start Zoloft 50 mg by mouth daily, with no refills. Patient was referred to Dr. Mallie Serrano, our Bariatric Psychologist, for evaluation due to her elevated PHQ-9 score and significant struggles with emotional eating.  - sertraline (ZOLOFT) 50 MG tablet; Take 1 tablet (50 mg total)  by mouth daily.  Dispense: 30 tablet; Refill: 0  10. 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 I recommend Tiffany Serrano begin the structured treatment plan as follows:  She has agreed to the Category 4 Plan + 200 calories.  Exercise goals: No exercise has been prescribed at this time.   Behavioral modification strategies: increasing lean protein intake, meal planning and cooking strategies, and keeping healthy foods in the home.  She was informed of the importance of frequent follow-up visits to maximize her success with intensive lifestyle modifications for her multiple health conditions. She was informed we would discuss her lab results at her next visit unless there is a critical issue that needs to be addressed sooner. Tiffany Serrano agreed to keep her next visit at the agreed upon time to discuss these results.  Objective:   Blood pressure 139/85, pulse 73, temperature 98.3 F (36.8 C), height _0  (1.626 m), weight (!) 498 lb (225.9 kg), last menstrual period 12/17/2021, SpO2 99 %. Body mass index  is 85.48 kg/m.  EKG: Normal sinus rhythm, rate 106 BPM.  Indirect Calorimeter completed today shows a VO2 of 499 and a REE of 3442.  Her calculated basal metabolic rate is 5427 thus her basal metabolic rate is better than expected.  General: Cooperative, alert, well developed, in no acute distress. HEENT: Conjunctivae and lids unremarkable. Cardiovascular: Regular rhythm.  Lungs: Normal work of breathing. Neurologic: No focal deficits.   Lab Results  Component Value Date   CREATININE 0.63 12/29/2021   BUN 9 12/29/2021   NA 140 12/29/2021   K 4.5 12/29/2021   CL 99 12/29/2021   CO2 22 12/29/2021   Lab Results  Component Value Date   ALT 31 12/29/2021   AST 28 12/29/2021   ALKPHOS 86  12/29/2021   BILITOT 0.4 12/29/2021   Lab Results  Component Value Date   HGBA1C 5.7 (H) 12/29/2021   HGBA1C 5.6 01/29/2018   Lab Results  Component Value Date   INSULIN 77.5 (H) 12/29/2021   Lab Results  Component Value Date   TSH 2.650 12/29/2021   Lab Results  Component Value Date   CHOL 143 12/29/2021   HDL 48 12/29/2021   LDLCALC 77 12/29/2021   TRIG 99 12/29/2021   No results found for: "WBC", "HGB", "HCT", "MCV", "PLT" No results found for: "IRON", "TIBC", "FERRITIN"  Attestation Statements:   Reviewed by clinician on day of visit: allergies, medications, problem list, medical history, surgical history, family history, social history, and previous encounter notes.  Time spent on visit including pre-visit chart review and post-visit charting and care was 60 minutes.   I, Trixie Dredge, am acting as transcriptionist for Coralie Common, MD.  This is the patient's first visit at [facility]. The patient's NEW PATIENT PACKET was reviewed at length. Included in the packet: current and past health history, medications, allergies, ROS, gynecologic history (women only), surgical history, family history, social history, weight history, weight loss surgery history (for those that have had weight loss surgery), nutritional evaluation, mood and food questionnaire, PHQ9, Epworth questionnaire, sleep habits questionnaire, patient life and health improvement goals questionnaire. These will all be scanned into the patient's chart under media.   During the visit, I independently reviewed the patient's EKG, bioimpedance scale results, and indirect calorimeter results. I used this information to tailor a meal plan for the patient that will help her to lose weight and will improve her obesity-related conditions going forward. I performed a medically necessary appropriate examination and/or evaluation. I discussed the assessment and treatment plan with the patient. The patient  was provided an opportunity to ask questions and all were answered. The patient agreed with the plan and demonstrated an understanding of the instructions. Labs were ordered at this visit and will be reviewed at the next visit unless more critical results need to be addressed immediately. Clinical information was updated and documented in the EMR.   Time spent on visit including pre-visit chart review and post-visit care was 60 minutes.   I have reviewed the above documentation for accuracy and completeness, and I agree with the above. - Coralie Common, MD

## 2022-01-03 NOTE — Progress Notes (Signed)
Office: [phone]  /  Fax: [phone]    Date: 01/17/2022   Appointment Start Time: 4:01pm Duration: 27 minutes Provider: Lawerance Cruel, Psy.D. Type of Session: Individual Therapy  Location of Patient: Home (private location) Location of Provider: Provider's Home (private office) Type of Contact: Telepsychological Visit via MyChart Video Visit  Session Content: Tiffany Serrano is a 26 y.o. female presenting for a follow-up appointment to address the previously established treatment goal of increasing coping skills.Today's appointment was a telepsychological visit. Tiffany Serrano provided verbal consent for today's telepsychological appointment and she is aware she is responsible for securing confidentiality on her end of the session. Prior to proceeding with today's appointment, Tiffany Serrano's physical location at the time of this appointment was obtained as well a phone number she could be reached at in the event of technical difficulties. Tiffany Serrano and this provider participated in today's telepsychological service.   This provider conducted a brief check-in. Besse shared "not a whole lot" has been going on since the last appointment with this provider. She acknowledged she did not contact any referral options. She provided verbal consent for this provider to re-send the previously shared e-mail. A risk assessment was completed. Tiffany Serrano denied experiencing suicidal, self-harm, and homicidal ideation, plan, and intent since the last appointment with this provider. She continues to acknowledge understanding regarding the importance of reaching out to trusted individuals and/or emergency resources if she is unable to ensure safety.   Tiffany Serrano reported her eating habits "have been going well," adding she lost nine pounds. She acknowledged challenges with eating her dinner portion. Further explored and processed. Emotional and physical hunger reviewed. Psychoeducation regarding triggers for emotional eating was provided.  Tiffany Serrano was provided a handout, and encouraged to utilize the handout between now and the next appointment to increase awareness of triggers and frequency. Tiffany Serrano agreed. This provider also discussed behavioral strategies for specific triggers, such as placing the utensil down when conversing to avoid mindless eating. Tiffany Serrano provided verbal consent during today's appointment for this provider to send a handout about triggers via e-mail. Overall, Tiffany Serrano was receptive to today's appointment as evidenced by openness to sharing, responsiveness to feedback, and willingness to explore triggers for emotional eating.  Mental Status Examination:  Appearance: neat Behavior: appropriate to circumstances Mood: depressed Affect: mood congruent Speech: WNL Eye Contact: appropriate Psychomotor Activity: WNL Gait: unable to assess Thought Process: linear, logical, and goal directed and denies suicidal, homicidal, and self-harm ideation, plan and intent  Thought Content/Perception: no hallucinations, delusions, bizarre thinking or behavior endorsed or observed Orientation: AAOx4 Memory/Concentration: memory, attention, language, and fund of knowledge intact  Insight: fair Judgment: fair  Interventions:  Conducted a brief chart review Conducted a risk assessment Provided empathic reflections and validation Reviewed content from the previous session Employed supportive psychotherapy interventions to facilitate reduced distress and to improve coping skills with identified stressors Psychoeducation provided regarding triggers for emotional eating behaviors  DSM-5 Diagnosis(es):  F50.89 Other Specified Feeding or Eating Disorder, Emotional Eating Behaviors and F33.0 Major Depressive Disorder, Recurrent Episode, Mild, With Anxious Distress  Treatment Goal & Progress: During the initial appointment with this provider, the following treatment goal was established: increase coping skills. Progress is limited, as  Tiffany Serrano has just begun treatment with this provider; however, she is receptive to the interaction and interventions and rapport is being established.   Plan: The next appointment is scheduled for 02/13/2022 at 4:30pm, which will be via MyChart Video Visit. The next session will focus on working towards the established treatment goal. Tiffany Serrano  agreed to establish care with a therapist for traditional therapeutic services prior to the next appointment with this provider.

## 2022-01-03 NOTE — Telephone Encounter (Signed)
Fyi.

## 2022-01-12 ENCOUNTER — Encounter (INDEPENDENT_AMBULATORY_CARE_PROVIDER_SITE_OTHER): Payer: Self-pay | Admitting: Family Medicine

## 2022-01-12 ENCOUNTER — Ambulatory Visit (INDEPENDENT_AMBULATORY_CARE_PROVIDER_SITE_OTHER): Payer: BC Managed Care – PPO | Admitting: Family Medicine

## 2022-01-12 VITALS — BP 130/88 | HR 104 | Temp 97.2°F | Ht 64.0 in | Wt >= 6400 oz

## 2022-01-12 DIAGNOSIS — R7303 Prediabetes: Secondary | ICD-10-CM

## 2022-01-12 DIAGNOSIS — F32A Depression, unspecified: Secondary | ICD-10-CM

## 2022-01-12 DIAGNOSIS — E559 Vitamin D deficiency, unspecified: Secondary | ICD-10-CM

## 2022-01-12 MED ORDER — METFORMIN HCL 500 MG PO TABS: 500.0000 mg | ORAL_TABLET | Freq: Every day | ORAL | 0 refills | Status: DC

## 2022-01-12 MED ORDER — ERGOCALCIFEROL 1.25 MG (50000 UT) PO CAPS: 50000.0000 [IU] | ORAL_CAPSULE | ORAL | 0 refills | Status: DC

## 2022-01-17 ENCOUNTER — Telehealth (INDEPENDENT_AMBULATORY_CARE_PROVIDER_SITE_OTHER): Payer: BC Managed Care – PPO | Admitting: Psychology

## 2022-01-17 DIAGNOSIS — F330 Major depressive disorder, recurrent, mild: Secondary | ICD-10-CM | POA: Diagnosis not present

## 2022-01-17 DIAGNOSIS — F5089 Other specified eating disorder: Secondary | ICD-10-CM | POA: Diagnosis not present

## 2022-01-25 ENCOUNTER — Ambulatory Visit (INDEPENDENT_AMBULATORY_CARE_PROVIDER_SITE_OTHER): Payer: BC Managed Care – PPO | Admitting: Family Medicine

## 2022-02-01 ENCOUNTER — Encounter (INDEPENDENT_AMBULATORY_CARE_PROVIDER_SITE_OTHER): Payer: Self-pay

## 2022-02-13 ENCOUNTER — Institutional Professional Consult (permissible substitution): Payer: BC Managed Care – PPO | Admitting: Neurology

## 2022-02-13 ENCOUNTER — Telehealth (INDEPENDENT_AMBULATORY_CARE_PROVIDER_SITE_OTHER): Payer: BC Managed Care – PPO | Admitting: Psychology

## 2022-03-01 ENCOUNTER — Institutional Professional Consult (permissible substitution): Payer: BC Managed Care – PPO | Admitting: Neurology

## 2022-11-15 DIAGNOSIS — Z0289 Encounter for other administrative examinations: Secondary | ICD-10-CM

## 2022-11-28 ENCOUNTER — Telehealth: Payer: Self-pay

## 2022-11-28 ENCOUNTER — Ambulatory Visit (INDEPENDENT_AMBULATORY_CARE_PROVIDER_SITE_OTHER): Payer: Medicaid Other | Admitting: Adult Health

## 2022-11-28 ENCOUNTER — Encounter (INDEPENDENT_AMBULATORY_CARE_PROVIDER_SITE_OTHER): Payer: Self-pay | Admitting: Adult Health

## 2022-11-28 VITALS — BP 135/84 | HR 88 | Temp 97.9°F | Ht 64.0 in | Wt >= 6400 oz

## 2022-11-28 DIAGNOSIS — E559 Vitamin D deficiency, unspecified: Secondary | ICD-10-CM

## 2022-11-28 DIAGNOSIS — Z6841 Body Mass Index (BMI) 40.0 and over, adult: Secondary | ICD-10-CM

## 2022-11-28 DIAGNOSIS — E039 Hypothyroidism, unspecified: Secondary | ICD-10-CM

## 2022-11-28 DIAGNOSIS — R5383 Other fatigue: Secondary | ICD-10-CM | POA: Diagnosis not present

## 2022-11-28 DIAGNOSIS — E669 Obesity, unspecified: Secondary | ICD-10-CM

## 2022-11-28 DIAGNOSIS — R7303 Prediabetes: Secondary | ICD-10-CM

## 2022-11-28 MED ORDER — SEMAGLUTIDE(0.25 OR 0.5MG/DOS) 2 MG/3ML ~~LOC~~ SOPN: 0.2500 mg | PEN_INJECTOR | SUBCUTANEOUS | 0 refills | Status: DC

## 2022-11-28 NOTE — Telephone Encounter (Signed)
Received through Cover My Meds: Ozempic denied. Not a covered benefit when used for weight loss.

## 2022-11-28 NOTE — Progress Notes (Signed)
WEIGHT SUMMARY AND BIOMETRICS  Vitals Temp: 97.9 F (36.6 C) BP: 135/84 Pulse Rate: 88 SpO2: 96 %   Anthropometric Measurements Height: 5\' 4"  (1.626 m) Weight: (!) 540 lb (244.9 kg) BMI (Calculated): 92.65 Weight at Last Visit: 489lb Weight Lost Since Last Visit: 0 Weight Gained Since Last Visit: 51lb Starting Weight: 498lb Total Weight Loss (lbs): 0 lb (0 kg)   No data recorded Other Clinical Data Fasting: yes Labs: no Today's Visit #: 5 Starting Date: 12/29/21    Chief Complaint:   OBESITY Tiffany Serrano is here to discuss her progress with her obesity treatment plan. She is on the the Category 4 Plan+200 and states she is following her eating plan approximately 0 % of the time. She states she is not currently exercising.   Interim History:  Last OV at [facility] July 2023 Her husband had surgery June 2023- lost his job and family lost medical coverage. Her husband returned to work Jan 2024. She has not been following any prescribed meal plan and due to chronic pain and body habitus unable to exercise.  Hydration-she estimates to drink 16-20 oz water/day  Subjective:   1. Vitamin D deficiency PCP has been managing Vit D supplementation  2. Prediabetes Lab Results  Component Value Date   HGBA1C 5.7 (H) 12/29/2021   HGBA1C 5.6 01/29/2018   She reports that the Metformin was ineffective. She denies family hx of MEN 2 or MTC She denies personal hx of pancreatitis. Her husband has permanent cath- they are not sexually active.  3. Hypothyroidism, unspecified type Per pt- she has been off Levothyroxine 25 mcg since 2017 She endorses persistent fatigue.  Assessment/Plan:   1. Vitamin D deficiency Please bring recent labs completed with PCP  2. Prediabetes Check Labs - Insulin, random Please bring recent labs completed with PCP Start  Semaglutide,0.25 or 0.5MG /DOS, 2 MG/3ML SOPN Inject 0.25 mg into the skin once a week. Dispense: 3 mL, Refills: 0 ordered    3. Hypothyroidism, unspecified type Check Labs - TSH + free T4  4. Other fatigue Please bring recent labs completed with PCP  5. Obesity with current BMI of 92.65  Tiffany Serrano is currently in the action stage of change. As such, her goal is to get back to weightloss efforts . She has agreed to the Category 4 Plan. +200  Exercise goals: No exercise has been prescribed at this time.  Behavioral modification strategies: increasing lean protein intake, decreasing simple carbohydrates, increasing vegetables, increasing water intake, and planning for success.  Tiffany Serrano has agreed to follow-up with our clinic in 2 weeks. She was informed of the importance of frequent follow-up visits to maximize her success with intensive lifestyle modifications for her multiple health conditions.   Tiffany Serrano was informed we would discuss her lab results at her next visit unless there is a critical issue that needs to be addressed sooner. Tiffany Serrano agreed to keep her next visit at the agreed upon time to discuss these results.  Objective:   Blood pressure 135/84, pulse 88, temperature 97.9 F (36.6 C), height 5\' 4"  (1.626 m), weight (!) 540 lb (244.9 kg), SpO2 96 %. Body mass index is 92.69 kg/m.  General: Cooperative, alert, well developed, in no acute distress. HEENT: Conjunctivae and lids unremarkable. Cardiovascular: Regular rhythm.  Lungs: Normal work of breathing. Neurologic: No focal deficits.   Lab Results  Component Value Date   CREATININE 0.63 12/29/2021   BUN 9 12/29/2021   NA 140 12/29/2021   K 4.5  12/29/2021   CL 99 12/29/2021   CO2 22 12/29/2021   Lab Results  Component Value Date   ALT 31 12/29/2021   AST 28 12/29/2021   ALKPHOS 86 12/29/2021   BILITOT 0.4 12/29/2021   Lab Results  Component Value Date   HGBA1C 5.7 (H) 12/29/2021   HGBA1C 5.6 01/29/2018   Lab Results  Component Value Date   INSULIN 77.5 (H) 12/29/2021   Lab Results  Component Value Date   TSH 2.650 12/29/2021    Lab Results  Component Value Date   CHOL 143 12/29/2021   HDL 48 12/29/2021   LDLCALC 77 12/29/2021   TRIG 99 12/29/2021   Lab Results  Component Value Date   VD25OH 35.7 12/29/2021   No results found for: "WBC", "HGB", "HCT", "MCV", "PLT" No results found for: "IRON", "TIBC", "FERRITIN"  Attestation Statements:   Reviewed by clinician on day of visit: allergies, medications, problem list, medical history, surgical history, family history, social history, and previous encounter notes.  I have reviewed the above documentation for accuracy and completeness, and I agree with the above. -  Pratyush Ammon d. Lynett Brasil, NP-C

## 2022-11-28 NOTE — Telephone Encounter (Signed)
PA submitted through Cover My Meds for Ozempic. Awaiting insurance determination. Key: [id number]

## 2022-11-29 ENCOUNTER — Other Ambulatory Visit (INDEPENDENT_AMBULATORY_CARE_PROVIDER_SITE_OTHER): Payer: Self-pay | Admitting: Adult Health

## 2022-11-29 ENCOUNTER — Telehealth: Payer: Self-pay

## 2022-11-29 LAB — TSH+FREE T4
Free T4: 1.32 ng/dL (ref 0.82–1.77)
TSH: 2.27 u[IU]/mL (ref 0.450–4.500)

## 2022-11-29 LAB — INSULIN, RANDOM: INSULIN: 92 u[IU]/mL — ABNORMAL HIGH (ref 2.6–24.9)

## 2022-11-29 MED ORDER — TIRZEPATIDE 2.5 MG/0.5ML ~~LOC~~ SOAJ: 2.5000 mg | SUBCUTANEOUS | 0 refills | Status: DC

## 2022-11-29 NOTE — Telephone Encounter (Signed)
PA submitted through Cover My Meds and [employer] for [facility]. Awaiting insurance determination.  Key: Sedonia Small

## 2022-11-29 NOTE — Telephone Encounter (Signed)
Orpha Bur has sent in Edgard. PA has been started.

## 2022-11-30 ENCOUNTER — Encounter: Payer: Self-pay | Admitting: Cardiology

## 2022-11-30 ENCOUNTER — Encounter: Payer: Self-pay | Admitting: *Deleted

## 2022-11-30 DIAGNOSIS — G8929 Other chronic pain: Secondary | ICD-10-CM | POA: Insufficient documentation

## 2022-11-30 DIAGNOSIS — M549 Dorsalgia, unspecified: Secondary | ICD-10-CM | POA: Insufficient documentation

## 2022-12-13 ENCOUNTER — Telehealth (INDEPENDENT_AMBULATORY_CARE_PROVIDER_SITE_OTHER): Payer: Self-pay

## 2022-12-13 NOTE — Telephone Encounter (Signed)
Prior [employer] for [employer] via cover my meds.  Awaiting response from insurance company.   Preferred drugs:  need to try and fail at least 2.    Canagliflozin (Invokana)  Dapagliflozin (Farxiga)  Dulaglutide (Trulicity)  Empagliflozin (Jardiance)  Ertugliflozin Psychologist, sport and exercise)  Exenatide ER (Bydureon; Bydureon BCise)  Exenatide IR (Byetta)  Glimepiride (Amaryl)  Glipizide (Glucotrol)  Glucophage (Metformin)  Glyburide (Diabeta; Glynase; Micronase)  Insulin degludec Evaristo Bury)  Insulin degludec and liraglutide (Xultophy)  Insulin detemir (Levemir)  Insulin glargine U100 (Basaglar; Lantus)  Insulin glargine U300 (Toujeo)  Insulin glargine-yfgn (Semglee)  Linagliptin (Tradjenta)  Liraglutide (Saxenda; Victoza)  Lixisenatide (Adlyxin)  Metformin (Glucophage; Glumetza; Riomet) History Therapeutic failure Amount From To Additional Note  Pioglitazone (Actos)  Saxagliptin (Onglyza)  Semaglutide (Ozempic; Rybelsus)  Semaglutide (Rybelsus)  Sitagliptin (Januvia)  Other (Please specify)

## 2022-12-14 NOTE — Telephone Encounter (Signed)
PA for Greggory Keen was denied. An appeal is available for the patient to complete. 12/14/22

## 2022-12-18 DIAGNOSIS — N9489 Other specified conditions associated with female genital organs and menstrual cycle: Secondary | ICD-10-CM | POA: Insufficient documentation

## 2022-12-19 ENCOUNTER — Telehealth (INDEPENDENT_AMBULATORY_CARE_PROVIDER_SITE_OTHER): Payer: Self-pay

## 2022-12-19 NOTE — Telephone Encounter (Signed)
PA for Ozempic has been denied. 12/19/22

## 2022-12-27 DIAGNOSIS — E282 Polycystic ovarian syndrome: Secondary | ICD-10-CM | POA: Insufficient documentation

## 2022-12-27 DIAGNOSIS — M5430 Sciatica, unspecified side: Secondary | ICD-10-CM | POA: Insufficient documentation

## 2022-12-27 DIAGNOSIS — M5020 Other cervical disc displacement, unspecified cervical region: Secondary | ICD-10-CM | POA: Insufficient documentation

## 2023-01-02 ENCOUNTER — Encounter (INDEPENDENT_AMBULATORY_CARE_PROVIDER_SITE_OTHER): Payer: Self-pay | Admitting: Adult Health

## 2023-01-03 ENCOUNTER — Encounter (INDEPENDENT_AMBULATORY_CARE_PROVIDER_SITE_OTHER): Payer: Self-pay | Admitting: Adult Health

## 2023-01-08 ENCOUNTER — Ambulatory Visit (INDEPENDENT_AMBULATORY_CARE_PROVIDER_SITE_OTHER): Payer: Medicaid Other | Admitting: Adult Health

## 2023-01-08 ENCOUNTER — Encounter (INDEPENDENT_AMBULATORY_CARE_PROVIDER_SITE_OTHER): Payer: Self-pay | Admitting: Adult Health

## 2023-01-08 VITALS — BP 136/83 | HR 81 | Temp 97.6°F | Ht 64.0 in | Wt >= 6400 oz

## 2023-01-08 DIAGNOSIS — E559 Vitamin D deficiency, unspecified: Secondary | ICD-10-CM

## 2023-01-08 DIAGNOSIS — R7303 Prediabetes: Secondary | ICD-10-CM | POA: Diagnosis not present

## 2023-01-08 DIAGNOSIS — E669 Obesity, unspecified: Secondary | ICD-10-CM

## 2023-01-08 DIAGNOSIS — E039 Hypothyroidism, unspecified: Secondary | ICD-10-CM | POA: Diagnosis not present

## 2023-01-08 DIAGNOSIS — R5383 Other fatigue: Secondary | ICD-10-CM | POA: Diagnosis not present

## 2023-01-08 DIAGNOSIS — Z6841 Body Mass Index (BMI) 40.0 and over, adult: Secondary | ICD-10-CM

## 2023-01-08 MED ORDER — VITAMIN D (ERGOCALCIFEROL) 1.25 MG (50000 UNIT) PO CAPS: 50000.0000 [IU] | ORAL_CAPSULE | ORAL | 0 refills | Status: DC

## 2023-01-08 MED ORDER — METFORMIN HCL 500 MG PO TABS: ORAL_TABLET | ORAL | 0 refills | Status: DC

## 2023-01-08 NOTE — Progress Notes (Signed)
WEIGHT SUMMARY AND BIOMETRICS  Vitals Temp: 97.6 F (36.4 C) BP: 136/83 Pulse Rate: 81 SpO2: 95 %   Anthropometric Measurements Height: 5\' 4"  (1.626 m) Weight: (!) 526 lb (238.6 kg) BMI (Calculated): 90.24 Weight at Last Visit: 540lb Weight Lost Since Last Visit: 14lb Weight Gained Since Last Visit: 0 Starting Weight: 498lb Total Weight Loss (lbs): 0 lb (0 kg)   No data recorded Other Clinical Data Fasting: Yes Labs: no Today's Visit #: 6 Starting Date: 12/29/21    Chief Complaint:   OBESITY Tiffany Serrano is here to discuss her progress with her obesity treatment plan. She is on the the Category 4 Plan and states she is following her eating plan approximately 50 % of the time. She states she is exercising Walking/NEAT daily..   Interim History:  [name] established with [facility]- anticipates metabolic surgery by end of 2024.  Her insurance will not cover GLP- therapy for Obesity tx.  She has been following prescribed meal plan at least 50% the last 2 weeks and reports reduction in diffuse pain.  She has also been able to lift and carry her 28 year old niece since losing 14 lbs!  Subjective:   1. Other fatigue Discussed Labs  Latest Reference Range & Units 12/29/21 10:11  Vitamin D, 25-Hydroxy 30.0 - 100.0 ng/mL 35.7  Vitamin B12 232 - 1,245 pg/mL 510   She is on OTC Vit D3 2,000 daily  2. Hypothyroidism, unspecified type Discussed Labs  Latest Reference Range & Units 11/28/22 08:12  TSH 0.450 - 4.500 uIU/mL 2.270  T4,Free(Direct) 0.82 - 1.77 ng/dL 4.69   Levels stable  3. Vitamin D deficiency Discussed Labs  Latest Reference Range & Units 12/29/21 10:11  Vitamin D, 25-Hydroxy 30.0 - 100.0 ng/mL 35.7  Vitamin B12 232 - 1,245 pg/mL 510   She is on OTC Vit D3 2,000 daily  4. Prediabetes Discussed Labs  Latest Reference Range & Units 12/29/21 10:11 11/28/22 08:12  Glucose 70 - 99 mg/dL 629 (H)   Hemoglobin B2W 4.8 - 5.6 % 5.7  (H)   Est. average glucose Bld gHb Est-mCnc mg/dL 413   INSULIN 2.6 - 24.4 uIU/mL 77.5 (H) 92.0 (H)  (H): Data is abnormally high  A1c and Insulin Levels- both above goal. She has previoulsy on Metformin and tolerated well. Her insurance will not cover GLP-1 therapy  Agreeable to restarting Metformin therapy  12/29/21 CMP GFR >60  Assessment/Plan:   1. Other fatigue Continue Cat 4 meal plan and regular activity.  2. Hypothyroidism, unspecified type Continue Cat 4 meal plan and regular activity.  3. Vitamin D deficiency Stop OTC Vit D Supplementation Start  Vitamin D, Ergocalciferol, (DRISDOL) 1.25 MG (50000 UNIT) CAPS capsule Take 1 capsule (50,000 Units total) by mouth every 7 (seven) days. Dispense: 4 capsule, Refills: 0 ordered   4. Prediabetes Start []   metFORMIN (GLUCOPHAGE) 500 MG tablet 1/2 tab daily with lunch for one week then increase to 1 full tab daily at lunch- hold at this dose Dispense: 30 tablet, Refills: 0 ordered  CHECK CMP IN LATE SUMMER 2024  5. Obesity with current BMI of 90.24  Tiffany Serrano is currently in the action stage of change. As such, her goal is to continue with weight loss efforts. She has agreed to the Category 4 Plan.   Exercise goals: All adults should avoid inactivity. Some physical activity is better than none, and adults who participate in any amount of physical activity gain some health  benefits. For additional and more extensive health benefits, adults should increase their aerobic physical activity to 300 minutes (5 hours) a week of moderate-intensity, or 150 minutes a week of vigorous-intensity aerobic physical activity, or an equivalent combination of moderate- and vigorous-intensity activity. Additional health benefits are gained by engaging in physical activity beyond this amount.   Behavioral modification strategies: increasing lean protein intake, decreasing simple carbohydrates, increasing vegetables, increasing water intake, meal planning  and cooking strategies, and planning for success.  [name] has agreed to follow-up with our clinic in 3 weeks. She was informed of the importance of frequent follow-up visits to maximize her success with intensive lifestyle modifications for her multiple health conditions.   Check IC at next OV- pt aware to arrive 30 mins prior to OV  Objective:   Blood pressure 136/83, pulse 81, temperature 97.6 F (36.4 C), height 5\' 4"  (1.626 m), weight (!) 526 lb (238.6 kg), SpO2 95%. Body mass index is 90.29 kg/m.  General: Cooperative, alert, well developed, in no acute distress. HEENT: Conjunctivae and lids unremarkable. Cardiovascular: Regular rhythm.  Lungs: Normal work of breathing. Neurologic: No focal deficits.   Lab Results  Component Value Date   CREATININE 0.63 12/29/2021   BUN 9 12/29/2021   NA 140 12/29/2021   K 4.5 12/29/2021   CL 99 12/29/2021   CO2 22 12/29/2021   Lab Results  Component Value Date   ALT 31 12/29/2021   AST 28 12/29/2021   ALKPHOS 86 12/29/2021   BILITOT 0.4 12/29/2021   Lab Results  Component Value Date   HGBA1C 5.7 (H) 12/29/2021   HGBA1C 5.6 01/29/2018   Lab Results  Component Value Date   INSULIN 92.0 (H) 11/28/2022   INSULIN 77.5 (H) 12/29/2021   Lab Results  Component Value Date   TSH 2.270 11/28/2022   Lab Results  Component Value Date   CHOL 143 12/29/2021   HDL 48 12/29/2021   LDLCALC 77 12/29/2021   TRIG 99 12/29/2021   Lab Results  Component Value Date   VD25OH 35.7 12/29/2021   No results found for: "WBC", "HGB", "HCT", "MCV", "PLT" No results found for: "IRON", "TIBC", "FERRITIN"  Attestation Statements:   Reviewed by clinician on day of visit: allergies, medications, problem list, medical history, surgical history, family history, social history, and previous encounter notes.  I have reviewed the above documentation for accuracy and completeness, and I agree with the above. -  Trigger Frasier d. Davarius Ridener, NP-C

## 2023-01-09 ENCOUNTER — Encounter: Payer: Self-pay | Admitting: Cardiology

## 2023-01-09 ENCOUNTER — Ambulatory Visit: Payer: Medicaid Other | Attending: Cardiology | Admitting: Cardiology

## 2023-01-09 VITALS — BP 146/92 | HR 107 | Ht 67.0 in | Wt >= 6400 oz

## 2023-01-09 DIAGNOSIS — R0609 Other forms of dyspnea: Secondary | ICD-10-CM

## 2023-01-09 DIAGNOSIS — I479 Paroxysmal tachycardia, unspecified: Secondary | ICD-10-CM | POA: Diagnosis not present

## 2023-01-09 DIAGNOSIS — G4733 Obstructive sleep apnea (adult) (pediatric): Secondary | ICD-10-CM

## 2023-01-09 DIAGNOSIS — Z6841 Body Mass Index (BMI) 40.0 and over, adult: Secondary | ICD-10-CM

## 2023-01-09 DIAGNOSIS — I10 Essential (primary) hypertension: Secondary | ICD-10-CM

## 2023-01-09 NOTE — Addendum Note (Signed)
Addended by: Baldo Ash D on: 01/09/2023 03:20 PM   Modules accepted: Orders

## 2023-01-09 NOTE — Progress Notes (Signed)
Cardiology Consultation:    Date:  01/09/2023   ID:  Tiffany Serrano, DOB [birth year], MRN [id number]  PCP:  Lars Mage, NP  Cardiologist:  Gypsy Balsam, MD   Referring MD: Erskine Emery, NP   Chief Complaint  Patient presents with   Abnormal monitor    History of Present Illness:    Tiffany Serrano is a 27 y.o. female who is being seen today for the evaluation of tachycardia at the request of Erskine Emery, NP.  Past medical history significant for essential hypertension, morbid obesity, obstructive sleep apnea, polycystic ovarian syndrome, hypothyroidism.  She was referred to me because she was noted to be tachycardic, Holter monitor was placed and she was find to have persistent sinus tachycardia average heart rate during the time of monitoring was 99 with maximal heart rate 162 minimum 65.  She is completely unaware of this.  She cannot do much because of morbid obesity and chronic back problem.  She is getting short of breath quite easily.  There is no chest pain tightness squeezing pressure burning chest.  She did see already surgeon for possible weight reduction surgery.  She is very interested in it and I think she should be.  She did have some issue with her heart in 2017 she did have extensive workup done at that time which was unrevealing.  She does not smoke smoked maybe for 1 year when she was 17.  Again because problems morbid obesity This  Past Medical History:  Diagnosis Date   Anxiety    Asthma    Back pain    Bilateral swelling of feet    Chronic headaches    Essential hypertension 10/15/2014   Gastroesophageal reflux disease 12/22/2014   Formatting of this note might be different from the original. Last Assessment & Plan: Zantac 150mg  BID prescribed Formatting of this note might be different from the original. Last Assessment & Plan: Zantac 150mg  BID prescribed   History of degenerative disc disease    Hypothyroidism    MDD (major depressive disorder),  single episode, severe , no psychosis (HCC) 06/07/2016   Formatting of this note might be different from the original. Last Assessment & Plan: Resolved. Situational 05/2016 with breakup. See GAD tab. Formatting of this note might be different from the original. Last Assessment & Plan: Resolved. Situational 05/2016 with breakup. See GAD tab.   Mild intermittent asthma without complication 01/30/2018   Morbid obesity (HCC)    Obstructive sleep apnea 01/30/2018   PCOS (polycystic ovarian syndrome)    Vitamin D deficiency     Past Surgical History:  Procedure Laterality Date   NO PAST SURGERIES      Current Medications: Current Meds  Medication Sig   albuterol (PROVENTIL HFA;VENTOLIN HFA) 108 (90 Base) MCG/ACT inhaler Inhale 2 puffs into the lungs every 6 (six) hours as needed for wheezing or shortness of breath.   celecoxib (CELEBREX) 100 MG capsule Take 100 mg by mouth daily.   gabapentin (NEURONTIN) 300 MG capsule Take 600 mg by mouth 3 (three) times daily.   metFORMIN (GLUCOPHAGE) 500 MG tablet 1/2 tab daily with lunch for one week then increase to 1 full tab daily at lunch- hold at this dose (Patient taking differently: Take 1,000 mg by mouth daily with breakfast. 1/2 tab daily with lunch for one week then increase to 1 full tab daily at lunch- hold at this dose)   methocarbamol (ROBAXIN) 500 MG tablet Take 500 mg by mouth 2 (  two) times daily.   olmesartan (BENICAR) 20 MG tablet Take 20 mg by mouth daily.   Vitamin D, Ergocalciferol, (DRISDOL) 1.25 MG (50000 UNIT) CAPS capsule Take 1 capsule (50,000 Units total) by mouth every 7 (seven) days.     Allergies:   Patient has no known allergies.   Social History   Socioeconomic History   Marital status: Married    Spouse name: Not on file   Number of children: Not on file   Years of education: Not on file   Highest education level: Not on file  Occupational History   Occupation: Hospital doctor   Occupation: Stay at Home Spouse  Tobacco Use    Smoking status: Former    Types: Cigars   Smokeless tobacco: Never  Vaping Use   Vaping status: Never Used  Substance and Sexual Activity   Alcohol use: Yes    Comment: "some times"   Drug use: Never   Sexual activity: Yes    Partners: Male  Other Topics Concern   Not on file  Social History Narrative   Originally from [place], [place].   Truck driver (long distance)   Lives with her Mom and mom's fiance   Has a dog   Enjoys relaxing in her free time, spending time with family   Completed college   Social Determinants of Health   Financial Resource Strain: Medium Risk (08/01/2022)   Received from [facility]   Overall Financial Resource Strain (CARDIA)    Difficulty of Paying Living Expenses: Somewhat hard  Food Insecurity: No Food Insecurity (08/01/2022)   Received from [facility], [facility]   Hunger Vital Sign    Worried About Running Out of Food in the Last Year: Never true    Ran Out of Food in the Last Year: Never true  Transportation Needs: No Transportation Needs (08/01/2022)   Received from [facility], [facility]   PRAPARE - Transportation    Lack of Transportation (Medical): No    Lack of Transportation (Non-Medical): No  Physical Activity: Unknown (08/01/2022)   Received from [facility], [facility]   Exercise Vital Sign    Days of Exercise per Week: 0 days    Minutes of Exercise per Session: Not on file  Stress: Stress Concern Present (08/01/2022)   Received from [facility], [facility] of Occupational Health - Occupational Stress Questionnaire    Feeling of Stress : To some extent  Social Connections: Somewhat Isolated (08/01/2022)   Received from [facility], [facility]   Social Network    How would you rate your social network (family, work, friends)?: Restricted participation with some degree of social isolation     Family History: The patient's family history includes Anxiety disorder in her  mother and sister; Arthritis in her maternal grandfather, maternal grandmother, mother, paternal grandfather, and paternal grandmother; Cancer in her father; Depression in her mother; Heart attack in her paternal grandfather; Hyperlipidemia in her mother; Hypertension in her paternal grandfather; Pancreatic cancer in her father; Stroke in her maternal grandmother; Thyroid disease in her mother. ROS:   Please see the history of present illness.    All 14 point review of systems negative except as described per history of present illness.  EKGs/Labs/Other Studies Reviewed:    The following studies were reviewed today:   EKG:  EKG Interpretation Date/Time:  Tuesday January 09 2023 14:46:12 EDT Ventricular Rate:  114 PR Interval:  180 QRS Duration:  86 QT Interval:  320 QTC Calculation: 441 R Axis:   9  Text Interpretation: Sinus tachycardia Otherwise normal ECG No previous ECGs available Confirmed by Gypsy Balsam [phone]) on 01/09/2023 2:50:07 PM    Recent Labs: 11/28/2022: TSH 2.270  Recent Lipid Panel    Component Value Date/Time   CHOL 143 12/29/2021 1011   TRIG 99 12/29/2021 1011   HDL 48 12/29/2021 1011   LDLCALC 77 12/29/2021 1011    Physical Exam:    VS:  BP (!) 146/92 (BP Location: Left Arm, Patient Position: Sitting)   Pulse (!) 107   Ht 5\' 7"  (1.702 m)   Wt (!) 527 lb 9.6 oz (239.3 kg)   SpO2 96%   BMI 82.63 kg/m     Wt Readings from Last 3 Encounters:  01/09/23 (!) 527 lb 9.6 oz (239.3 kg)  01/08/23 (!) 526 lb (238.6 kg)  11/28/22 (!) 540 lb (244.9 kg)     GEN:  Well nourished, well developed in no acute distress HEENT: Normal NECK: No JVD; No carotid bruits LYMPHATICS: No lymphadenopathy CARDIAC: RRR, no murmurs, no rubs, no gallops RESPIRATORY:  Clear to auscultation without rales, wheezing or rhonchi  ABDOMEN: Soft, non-tender, non-distended MUSCULOSKELETAL:  No edema; No deformity  SKIN: Warm and dry NEUROLOGIC:  Alert and oriented x 3 PSYCHIATRIC:   Normal affect   ASSESSMENT:    1. Essential hypertension   2. Obstructive sleep apnea   3. Morbid obesity with BMI of 60.0-69.9, adult (HCC)   4. Paroxysmal tachycardia (HCC)    PLAN:    In order of problems listed above:  Tachycardia.  Look like sinus tachycardia on the monitor I think sinus tachycardia is secondary to her morbid obesity simply heart have to work harder to supply blood to her massive body.  However we will make sure that structurally her heart is normal, I will schedule her to have echocardiogram done to assess left ventricle ejection fraction.  She did have TSH checked which is normal.  If echocardiogram will, normal iron thing and intervention will be needed. Obstructive sleep apnea: This is managed by primary care physician, she is on CPAP mask.  She just recently got new equipment trying to use it. Super morbid obesity obviously huge problem which is responsible for her additional issues.  I am happy for her that she is talking about potential weight reduction surgery which would improve her overall wellbeing significantly   Medication Adjustments/Labs and Tests Ordered: Current medicines are reviewed at length with the patient today.  Concerns regarding medicines are outlined above.  Orders Placed This Encounter  Procedures   EKG 12-Lead   No orders of the defined types were placed in this encounter.   Signed, Georgeanna Lea, MD, [facility]. 01/09/2023 3:10 PM    [facility]

## 2023-01-09 NOTE — Patient Instructions (Signed)
 Medication Instructions:  Your physician recommends that you continue on your current medications as directed. Please refer to the Current Medication list given to you today.  *If you need a refill on your cardiac medications before your next appointment, please call your pharmacy*   Lab Work: None Ordered If you have labs (blood work) drawn today and your tests are completely normal, you will receive your results only by: [place] (if you have MyChart) OR A paper copy in the mail If you have any lab test that is abnormal or we need to change your treatment, we will call you to review the results.   Testing/Procedures: Your physician has requested that you have an echocardiogram. Echocardiography is a painless test that uses sound waves to create images of your heart. It provides your doctor with information about the size and shape of your heart and how well your heart's chambers and valves are working. This procedure takes approximately one hour. There are no restrictions for this procedure. Please do NOT wear cologne, perfume, aftershave, or lotions (deodorant is allowed). Please arrive 15 minutes prior to your appointment time.    Follow-Up: At [facility], you and your health needs are our priority.  As part of our continuing mission to provide you with exceptional heart care, we have created designated Provider Care Teams.  These Care Teams include your primary Cardiologist (physician) and Advanced Practice Providers (APPs -  Physician Assistants and Nurse Practitioners) who all work together to provide you with the care you need, when you need it.  We recommend signing up for the patient portal called "MyChart".  Sign up information is provided on this After Visit Summary.  MyChart is used to connect with patients for Virtual Visits (Telemedicine).  Patients are able to view lab/test results, encounter notes, upcoming appointments, etc.  Non-urgent messages can be sent to your  provider as well.   To learn more about what you can do with MyChart, go to [email or web address].    Your next appointment:   3 month(s)  The format for your next appointment:   In Person  Provider:   Jenne Campus, MD    Other Instructions NA

## 2023-01-23 ENCOUNTER — Ambulatory Visit: Payer: Medicaid Other

## 2023-02-08 ENCOUNTER — Encounter (INDEPENDENT_AMBULATORY_CARE_PROVIDER_SITE_OTHER): Payer: Self-pay | Admitting: Adult Health

## 2023-02-08 ENCOUNTER — Ambulatory Visit (INDEPENDENT_AMBULATORY_CARE_PROVIDER_SITE_OTHER): Payer: Medicaid Other | Admitting: Adult Health

## 2023-02-08 VITALS — BP 115/74 | HR 90 | Temp 97.6°F | Ht 64.0 in | Wt >= 6400 oz

## 2023-02-08 DIAGNOSIS — E559 Vitamin D deficiency, unspecified: Secondary | ICD-10-CM

## 2023-02-08 DIAGNOSIS — R7303 Prediabetes: Secondary | ICD-10-CM

## 2023-02-08 DIAGNOSIS — R0602 Shortness of breath: Secondary | ICD-10-CM

## 2023-02-08 DIAGNOSIS — I10 Essential (primary) hypertension: Secondary | ICD-10-CM

## 2023-02-08 DIAGNOSIS — Z6841 Body Mass Index (BMI) 40.0 and over, adult: Secondary | ICD-10-CM

## 2023-02-08 DIAGNOSIS — E669 Obesity, unspecified: Secondary | ICD-10-CM

## 2023-02-08 MED ORDER — WEGOVY 0.25 MG/0.5ML ~~LOC~~ SOAJ: 0.2500 mg | SUBCUTANEOUS | 0 refills | Status: DC

## 2023-02-08 MED ORDER — METFORMIN HCL 500 MG PO TABS: ORAL_TABLET | ORAL | 0 refills | Status: DC

## 2023-02-08 NOTE — Progress Notes (Signed)
WEIGHT SUMMARY AND BIOMETRICS  Vitals Temp: 97.6 F (36.4 C) BP: 115/74 Pulse Rate: 90 SpO2: 95 %   Anthropometric Measurements Height: 5\' 4"  (1.626 m) Weight: (!) 524 lb (237.7 kg) BMI (Calculated): 89.9 Weight at Last Visit: 526 lb Weight Lost Since Last Visit: 2 lb Weight Gained Since Last Visit: 0 Starting Weight: 498 lbs Total Weight Loss (lbs): 0 lb (0 kg)   No data recorded Other Clinical Data RMR: 3067 Fasting: yes Labs: yes Today's Visit #: 7 Starting Date: 12/29/21    Chief Complaint:   OBESITY Tiffany Serrano is here to discuss her progress with her obesity treatment plan. She is on the the Category 4 Plan and states she is following her eating plan approximately 40 % of the time. She states she is exercising Walking Daily.   Interim History [name] started and titrated up to full tablet of Metformin 500mg  every day- denies GI upset.  She was able to walk up to 5 miles/day while on a family vacation in [place]- great job!  Of Note- She is NOT sexually active  Subjective:   1. Hypertension, unspecified type BP at goal at OV She denies CP with exertion.  2. SOBOE (shortness of breath on exertion)  12/29/21 08:00  RMR 3442  Waist Measurement  79 inches    02/08/23 09:00  RMR 3067   Metabolism slightly decreased, however stable.  3. Vitamin D deficiency  Latest Reference Range & Units 12/29/21 10:11  Vitamin D, 25-Hydroxy 30.0 - 100.0 ng/mL 35.7  Vitamin B12 232 - 1,245 pg/mL 510   She is on twice weekly Ergocalciferol- denies N/V/Muscle Weakness  4. Prediabetes Lab Results  Component Value Date   HGBA1C 5.7 (H) 12/29/2021   HGBA1C 5.6 01/29/2018    Started on Metformin and titrated up full 500mg  tablet per day- denies GI upset She denies family hx of MEN 2 ot MTC She denies personal hx of pancreatitis. Her insurance will NOW COVER [phone]- agreeable to stating GLP-1 therapy She is NOT sexually active  Assessment/Plan:   1.  Hypertension, unspecified type Check Labs - Comprehensive metabolic panel  2. SOBOE (shortness of breath on exertion) Check IC Remain at Cat 4 meal plan  3. Vitamin D deficiency Check Labs - VITAMIN D 25 Hydroxy (Vit-D Deficiency, Fractures)  4. Prediabetes Check Labs - Hemoglobin A1c - Insulin, random Refill metFORMIN (GLUCOPHAGE) 500 MG tablet 1 tab daily with a meal Dispense: 30 tablet, Refills: 0 ordered   5. Obesity with current BMI of 90.24 Start Semaglutide-Weight Management (WEGOVY) 0.25 MG/0.5ML SOAJ Inject 0.25 mg into the skin once a week. Dispense: 2 mL, Refills: 0 ordered   Tiffany Serrano is currently in the action stage of change. As such, her goal is to continue with weight loss efforts. She has agreed to the Category 4 Plan.   Exercise goals: All adults should avoid inactivity. Some physical activity is better than none, and adults who participate in any amount of physical activity gain some health benefits. Adults should also include muscle-strengthening activities that involve all major muscle groups on 2 or more days a week.  Behavioral modification strategies: increasing lean protein intake, decreasing simple carbohydrates, increasing vegetables, increasing water intake, no skipping meals, meal planning and cooking strategies, keeping healthy foods in the home, and planning for success.  [name] has agreed to follow-up with our clinic in 3 weeks. She was informed of the importance of frequent follow-up visits to maximize her success with intensive lifestyle modifications  for her multiple health conditions.   [name] was informed we would discuss her lab results at her next visit unless there is a critical issue that needs to be addressed sooner. [name] agreed to keep her next visit at the agreed upon time to discuss these results.  Objective:   Blood pressure 115/74, pulse 90, temperature 97.6 F (36.4 C), height 5\' 4"  (1.626 m), weight (!) 524 lb (237.7 kg), SpO2  95%. Body mass index is 89.94 kg/m.  General: Cooperative, alert, well developed, in no acute distress. HEENT: Conjunctivae and lids unremarkable. Cardiovascular: Regular rhythm.  Lungs: Normal work of breathing. Neurologic: No focal deficits.   Lab Results  Component Value Date   CREATININE 0.63 12/29/2021   BUN 9 12/29/2021   NA 140 12/29/2021   K 4.5 12/29/2021   CL 99 12/29/2021   CO2 22 12/29/2021   Lab Results  Component Value Date   ALT 31 12/29/2021   AST 28 12/29/2021   ALKPHOS 86 12/29/2021   BILITOT 0.4 12/29/2021   Lab Results  Component Value Date   HGBA1C 5.7 (H) 12/29/2021   HGBA1C 5.6 01/29/2018   Lab Results  Component Value Date   INSULIN 92.0 (H) 11/28/2022   INSULIN 77.5 (H) 12/29/2021   Lab Results  Component Value Date   TSH 2.270 11/28/2022   Lab Results  Component Value Date   CHOL 143 12/29/2021   HDL 48 12/29/2021   LDLCALC 77 12/29/2021   TRIG 99 12/29/2021   Lab Results  Component Value Date   VD25OH 35.7 12/29/2021   No results found for: "WBC", "HGB", "HCT", "MCV", "PLT" No results found for: "IRON", "TIBC", "FERRITIN"  Attestation Statements:   Reviewed by clinician on day of visit: allergies, medications, problem list, medical history, surgical history, family history, social history, and previous encounter notes.  I have reviewed the above documentation for accuracy and completeness, and I agree with the above. -  Aiyana Stegmann d. Thekla Colborn, NP-C

## 2023-02-13 ENCOUNTER — Telehealth (INDEPENDENT_AMBULATORY_CARE_PROVIDER_SITE_OTHER): Payer: Self-pay

## 2023-02-13 NOTE — Telephone Encounter (Signed)
PA submitted for [facility], waiting on a determination. 1:46 02/13/23 KP

## 2023-02-14 ENCOUNTER — Telehealth (INDEPENDENT_AMBULATORY_CARE_PROVIDER_SITE_OTHER): Payer: Self-pay

## 2023-02-14 ENCOUNTER — Telehealth (INDEPENDENT_AMBULATORY_CARE_PROVIDER_SITE_OTHER): Payer: Self-pay | Admitting: Adult Health

## 2023-02-14 NOTE — Telephone Encounter (Signed)
PA for [facility] was denied. An appeal is available.  02/14/23 9:41 KP

## 2023-02-14 NOTE — Telephone Encounter (Signed)
Submitted a new PA for [employer] due to a computer error that changed my answers on the previous one. Waiting now for a determination.12:49 02/14/23 KP

## 2023-02-15 ENCOUNTER — Encounter (INDEPENDENT_AMBULATORY_CARE_PROVIDER_SITE_OTHER): Payer: Self-pay

## 2023-02-15 NOTE — Telephone Encounter (Signed)
Additional clinical questions answered for PA for [facility] through Cover My Meds.

## 2023-02-15 NOTE — Telephone Encounter (Signed)
Received through Cover My Meds: Message from plan: Approved. WEGOVY 0.25MG /0.5ML Soln Auto-inj is approved from 02/15/2023 to 08/18/2023. All strengths of the drug are approved.. Authorization Expiration Date: August 18, 2023.

## 2023-03-06 DIAGNOSIS — G8918 Other acute postprocedural pain: Secondary | ICD-10-CM | POA: Insufficient documentation

## 2023-03-13 ENCOUNTER — Telehealth (INDEPENDENT_AMBULATORY_CARE_PROVIDER_SITE_OTHER): Payer: Medicaid Other | Admitting: Adult Health

## 2023-03-13 DIAGNOSIS — E278 Other specified disorders of adrenal gland: Secondary | ICD-10-CM | POA: Diagnosis not present

## 2023-03-13 DIAGNOSIS — E669 Obesity, unspecified: Secondary | ICD-10-CM

## 2023-03-13 DIAGNOSIS — R7303 Prediabetes: Secondary | ICD-10-CM

## 2023-03-13 DIAGNOSIS — Z6841 Body Mass Index (BMI) 40.0 and over, adult: Secondary | ICD-10-CM

## 2023-03-13 MED ORDER — METFORMIN HCL 500 MG PO TABS: ORAL_TABLET | ORAL | 0 refills | Status: DC

## 2023-03-13 MED ORDER — WEGOVY 0.5 MG/0.5ML ~~LOC~~ SOAJ: 0.5000 mg | SUBCUTANEOUS | 0 refills | Status: DC

## 2023-03-13 NOTE — Progress Notes (Addendum)
WEIGHT SUMMARY AND BIOMETRICS  No data recorded No data recorded No data recorded No data recorded  Chief Complaint:   I connected with  Tiffany Serrano on 03/19/23 by a video and audio enabled telemedicine application and verified that I am speaking with the correct person using two identifiers.  Patient Location: Home  Provider Location: Office/Clinic  I discussed the limitations of evaluation and management by telemedicine. The patient expressed understanding and agreed to proceed.   OBESITY Tiffany Serrano is here to discuss her progress with her obesity treatment plan. She is on the the Category 4 Plan and states she is following her eating plan approximately 20 % of the time. She states she is exercising Casual Walking daily.   Interim History:  03/06/2023 Exploratory laparotomy Right salpingoophorectomy   She was admitted for 2 days, then d/c'd home stable  She denies GI upset or fever.  She reports pain is well managed with PRN Narcotics. She denies constipation with Narcotic use.  OV conducted virtually to allow Tiffany Serrano to remain at home during her recovery.  Subjective:   1. Adrenal mass (HCC) 03/06/2023 [facility] Exploratory laparotomy Right salpingoophorectomy   Hospital Course: The patient was admitted on the day of scheduled surgery and proceeded to the operating room as scheduled for the stated procedure without complications, EBL 50. (For complete operative information, please see operative report).  Postoperatively she was admitted to the floor. Pain was initially managed with  PO Tylenol, Ibuprofen, robaxin scheduled, oxycodone PRN which was transitioned to PO. The patient was tolerating a regular diet on postoperative day number 1. Preoperative Hgb 14.1, with postoperative Hgb stable at 12.4. Preoperative Cr 0.62, with postoperative Cr stable 0.54. Foley cath was discontinued on postoperative day number 1 and patient was able to void without  difficulty.   Patient was felt to be stable for discharge on postoperative day number 2 when she was tolerating a regular diet, pain was controlled with PO medications, and she was ambulating and voiding without difficulty. Vital signs were stable and physical exam remained benign throughout her hospital stay. Incision was clean,dry, and intact with staples.   She will follow up per below. She was provided with prescriptions for the medications listed below. She verbalized understanding, agrees with the plan of care, and all questions answered to her satisfaction. She was provided with written discharge instructions with contact numbers to reach [place] 24 hours a day.    PDMP Reviewed- 3 days of Oxycodone 5mg  provided.  2. Prediabetes She was started on Wegovy 0.25mg  on/about 02/08/2023 She held one dose prior to surgery, she restarted Wegvoy 0.25mg  on 03/10/2023 She has one more injection of loading dose.  Denies mass in neck, dysphagia, dyspepsia, persistent hoarseness, abdominal pain, or N/V/C    Latest Reference Range & Units 12/29/21 10:11 11/28/22 08:12  INSULIN 2.6 - 24.9 uIU/mL 77.5 (H) 92.0 (H)  (H): Data is abnormally high  Assessment/Plan:   1. Adrenal mass [facility]) F/u with surgeon as directed.  2. Prediabetes Refill metFORMIN (GLUCOPHAGE) 500 MG tablet 1 tab daily with a meal Dispense: 30 tablet, Refills: 0 ordered   3. Obesity with current BMI of 90.24 Refill and increase Semaglutide-Weight Management (WEGOVY) 0.5 MG/0.5ML SOAJ Inject 0.5 mg into the skin once a week. Dispense: 2 mL, Refills: 0 ordered   Tiffany Serrano is currently in the action stage of change. As such, her goal is to continue with weight loss efforts. She has agreed to the Category 4 Plan.  Exercise goals: No exercise has been prescribed at this time.  Behavioral modification strategies: increasing lean protein intake, decreasing simple carbohydrates, increasing vegetables, increasing water intake, no skipping  meals, meal planning and cooking strategies, and planning for success.  Tiffany Serrano has agreed to follow-up with our clinic in 4 weeks. She was informed of the importance of frequent follow-up visits to maximize her success with intensive lifestyle modifications for her multiple health conditions.   Labs ordered 02/08/2023- unable to obtain specimen due to dehydration.  Objective:   There were no vitals taken for this visit. There is no height or weight on file to calculate BMI.  General: Cooperative, alert, well developed, in no acute distress. HEENT: Conjunctivae and lids unremarkable. Cardiovascular: Regular rhythm.  Lungs: Normal work of breathing. Neurologic: No focal deficits.   Lab Results  Component Value Date   CREATININE 0.63 12/29/2021   BUN 9 12/29/2021   NA 140 12/29/2021   K 4.5 12/29/2021   CL 99 12/29/2021   CO2 22 12/29/2021   Lab Results  Component Value Date   ALT 31 12/29/2021   AST 28 12/29/2021   ALKPHOS 86 12/29/2021   BILITOT 0.4 12/29/2021   Lab Results  Component Value Date   HGBA1C 5.7 (H) 12/29/2021   HGBA1C 5.6 01/29/2018   Lab Results  Component Value Date   INSULIN 92.0 (H) 11/28/2022   INSULIN 77.5 (H) 12/29/2021   Lab Results  Component Value Date   TSH 2.270 11/28/2022   Lab Results  Component Value Date   CHOL 143 12/29/2021   HDL 48 12/29/2021   LDLCALC 77 12/29/2021   TRIG 99 12/29/2021   Lab Results  Component Value Date   VD25OH 35.7 12/29/2021   No results found for: "WBC", "HGB", "HCT", "MCV", "PLT" No results found for: "IRON", "TIBC", "FERRITIN"   Attestation Statements:   Reviewed by clinician on day of visit: allergies, medications, problem list, medical history, surgical history, family history, social history, and previous encounter notes.  Time spent on visit including pre-visit chart review and post-visit care and charting was 15 minutes.   I have reviewed the above documentation for accuracy and  completeness, and I agree with the above. -  Demarie Hyneman d. Laster Appling, NP-C

## 2023-03-15 NOTE — Addendum Note (Signed)
Addended by: William Hamburger D on: 03/15/2023 01:14 PM   Modules accepted: Level of Service

## 2023-03-19 NOTE — Addendum Note (Signed)
Addended by: William Hamburger D on: 03/19/2023 12:02 PM   Modules accepted: Level of Service

## 2023-04-03 ENCOUNTER — Ambulatory Visit (INDEPENDENT_AMBULATORY_CARE_PROVIDER_SITE_OTHER): Payer: Medicaid Other | Admitting: Adult Health

## 2023-04-03 ENCOUNTER — Encounter (INDEPENDENT_AMBULATORY_CARE_PROVIDER_SITE_OTHER): Payer: Self-pay | Admitting: Adult Health

## 2023-04-03 VITALS — BP 134/74 | HR 76 | Temp 97.5°F | Ht 64.0 in | Wt >= 6400 oz

## 2023-04-03 DIAGNOSIS — Z6841 Body Mass Index (BMI) 40.0 and over, adult: Secondary | ICD-10-CM

## 2023-04-03 DIAGNOSIS — E559 Vitamin D deficiency, unspecified: Secondary | ICD-10-CM

## 2023-04-03 DIAGNOSIS — R7303 Prediabetes: Secondary | ICD-10-CM | POA: Diagnosis not present

## 2023-04-03 DIAGNOSIS — I10 Essential (primary) hypertension: Secondary | ICD-10-CM

## 2023-04-03 DIAGNOSIS — E669 Obesity, unspecified: Secondary | ICD-10-CM

## 2023-04-03 DIAGNOSIS — R5383 Other fatigue: Secondary | ICD-10-CM

## 2023-04-03 MED ORDER — SEMAGLUTIDE-WEIGHT MANAGEMENT 1 MG/0.5ML ~~LOC~~ SOAJ: 1.0000 mg | SUBCUTANEOUS | 0 refills | Status: DC

## 2023-04-03 NOTE — Progress Notes (Signed)
WEIGHT SUMMARY AND BIOMETRICS  Vitals Temp: (!) 97.5 F (36.4 C) BP: 134/74 Pulse Rate: 76 SpO2: 97 %   Anthropometric Measurements Height: 5\' 4"  (1.626 m) Weight: (!) 495 lb (224.5 kg) BMI (Calculated): 84.92 Weight at Last Visit: 524lb Weight Lost Since Last Visit: 29lb Weight Gained Since Last Visit: 0 Starting Weight: 498lb Total Weight Loss (lbs): 0 lb (0 kg)   No data recorded Other Clinical Data Fasting: yes Labs: no Today's Visit #: 9 Starting Date: 12/29/21    Chief Complaint:   OBESITY Tiffany Serrano is here to discuss her progress with her obesity treatment plan. She is on the the Category 4 Plan and states she is following her eating plan approximately 30 % of the time. She states she is exercising Walking daily.   Interim History:  02/08/2023 in office [facility]  Weight 524 lbs Started on weekly Wegovy 0.25mg  injection  03/13/2023 Virtual OV with [facility] Wegovy increased to 0.5mg  once weekly injection She has had two injections at increased strength Denies mass in neck, dysphagia, dyspepsia, persistent hoarseness, abdominal pain, or N/V/C   Sleep- nightly CPAP, however still waking fatigued  Exercise-daily walking  Hydration-she estimates to drink >60 oz water/day  Subjective:   1. Prediabetes 02/08/2023 in office [facility]  Weight 524 lbs Started on weekly Wegovy 0.25mg  injection  03/13/2023 Virtual OV with [facility] Wegovy increased to 0.5mg  once weekly injection She has had two injections at increased strength Denies mass in neck, dysphagia, dyspepsia, persistent hoarseness, abdominal pain, or N/V/C   She has continued daily Metformin 500mg   2. Vitamin D deficiency  Latest Reference Range & Units 12/29/21 10:11  Vitamin D, 25-Hydroxy 30.0 - 100.0 ng/mL 35.7   She is taking OTC Vit D3 5,000 international units daily She endorses persistent fatigue levels.  3. Hypertension, unspecified type She is on daily Benicar 20 mg  4. Other fatigue Sleep- nightly  CPAP, however still waking fatigued  Assessment/Plan:   1. Prediabetes Check Labs - Hemoglobin A1c - Insulin, random  2. Vitamin D deficiency Check Labs - VITAMIN D 25 Hydroxy (Vit-D Deficiency, Fractures)  3. Hypertension, unspecified type Check Labs - Comprehensive metabolic panel  4. Other fatigue Check Labs - Vitamin B12  5. Obesity with current BMI of 84.92 Refill and Increase  Semaglutide-Weight Management 1 MG/0.5ML SOAJ Inject 1 mg into the skin once a week. Dispense: 2 mL, Refills: 0 ordered   Tiffany Serrano is currently in the action stage of change. As such, her goal is to continue with weight loss efforts. She has agreed to the Category 4 Plan.   Dinner: 550-700 calories with at least 45g protein  Exercise goals: All adults should avoid inactivity. Some physical activity is better than none, and adults who participate in any amount of physical activity gain some health benefits. Adults should also include muscle-strengthening activities that involve all major muscle groups on 2 or more days a week.  Behavioral modification strategies: increasing lean protein intake, decreasing simple carbohydrates, increasing vegetables, increasing water intake, no skipping meals, meal planning and cooking strategies, and planning for success.  Tiffany Serrano has agreed to follow-up with our clinic in 4 weeks. She was informed of the importance of frequent follow-up visits to maximize her success with intensive lifestyle modifications for her multiple health conditions.   Tiffany Serrano was informed we would discuss her lab results at her next visit unless there is a critical issue that needs to be addressed sooner. Tiffany Serrano agreed to keep her next visit at  the agreed upon time to discuss these results.  Objective:   Blood pressure 134/74, pulse 76, temperature (!) 97.5 F (36.4 C), height 5\' 4"  (1.626 m), weight (!) 495 lb (224.5 kg), SpO2 97%. Body mass index is 84.97 kg/m.  General: Cooperative,  alert, well developed, in no acute distress. HEENT: Conjunctivae and lids unremarkable. Cardiovascular: Regular rhythm.  Lungs: Normal work of breathing. Neurologic: No focal deficits.   Lab Results  Component Value Date   CREATININE 0.63 12/29/2021   BUN 9 12/29/2021   NA 140 12/29/2021   K 4.5 12/29/2021   CL 99 12/29/2021   CO2 22 12/29/2021   Lab Results  Component Value Date   ALT 31 12/29/2021   AST 28 12/29/2021   ALKPHOS 86 12/29/2021   BILITOT 0.4 12/29/2021   Lab Results  Component Value Date   HGBA1C 5.7 (H) 12/29/2021   HGBA1C 5.6 01/29/2018   Lab Results  Component Value Date   INSULIN 92.0 (H) 11/28/2022   INSULIN 77.5 (H) 12/29/2021   Lab Results  Component Value Date   TSH 2.270 11/28/2022   Lab Results  Component Value Date   CHOL 143 12/29/2021   HDL 48 12/29/2021   LDLCALC 77 12/29/2021   TRIG 99 12/29/2021   Lab Results  Component Value Date   VD25OH 35.7 12/29/2021   No results found for: "WBC", "HGB", "HCT", "MCV", "PLT" No results found for: "IRON", "TIBC", "FERRITIN"   Attestation Statements:   Reviewed by clinician on day of visit: allergies, medications, problem list, medical history, surgical history, family history, social history, and previous encounter notes.  I have reviewed the above documentation for accuracy and completeness, and I agree with the above. -  Carlitos Bottino d. Jonta Gastineau, NP-C

## 2023-04-04 LAB — COMPREHENSIVE METABOLIC PANEL
ALT: 21 [IU]/L (ref 0–32)
AST: 17 [IU]/L (ref 0–40)
Albumin: 4 g/dL (ref 4.0–5.0)
Alkaline Phosphatase: 87 [IU]/L (ref 44–121)
BUN/Creatinine Ratio: 17 (ref 9–23)
BUN: 12 mg/dL (ref 6–20)
Bilirubin Total: 0.3 mg/dL (ref 0.0–1.2)
CO2: 23 mmol/L (ref 20–29)
Calcium: 9.3 mg/dL (ref 8.7–10.2)
Chloride: 104 mmol/L (ref 96–106)
Creatinine, Ser: 0.69 mg/dL (ref 0.57–1.00)
Globulin, Total: 3.2 g/dL (ref 1.5–4.5)
Glucose: 71 mg/dL (ref 70–99)
Potassium: 4.1 mmol/L (ref 3.5–5.2)
Sodium: 142 mmol/L (ref 134–144)
Total Protein: 7.2 g/dL (ref 6.0–8.5)
eGFR: 121 mL/min/{1.73_m2} (ref >59)

## 2023-04-04 LAB — INSULIN, RANDOM: INSULIN: 31.2 u[IU]/mL — ABNORMAL HIGH (ref 2.6–24.9)

## 2023-04-04 LAB — HEMOGLOBIN A1C
Est. average glucose Bld gHb Est-mCnc: 111 mg/dL
Hgb A1c MFr Bld: 5.5 % (ref 4.8–5.6)

## 2023-04-04 LAB — VITAMIN B12: Vitamin B-12: 481 pg/mL (ref 232–1245)

## 2023-04-04 LAB — VITAMIN D 25 HYDROXY (VIT D DEFICIENCY, FRACTURES): Vit D, 25-Hydroxy: 23.2 ng/mL — ABNORMAL LOW (ref 30.0–100.0)

## 2023-04-10 ENCOUNTER — Telehealth: Payer: Self-pay | Admitting: Cardiology

## 2023-04-10 NOTE — Telephone Encounter (Signed)
Upon calling patient to remind her of her video visit tomorrow she inquired to see if it was still necessary as she did not get her echo and has had her surgery. Please let the patient know if you would still like her to be seen. Best number [phone]

## 2023-04-11 ENCOUNTER — Encounter: Payer: Self-pay | Admitting: Cardiology

## 2023-04-11 ENCOUNTER — Ambulatory Visit: Payer: Medicaid Other | Attending: Cardiology | Admitting: Cardiology

## 2023-04-11 VITALS — Ht 68.0 in | Wt >= 6400 oz

## 2023-04-11 DIAGNOSIS — Z6841 Body Mass Index (BMI) 40.0 and over, adult: Secondary | ICD-10-CM

## 2023-04-11 DIAGNOSIS — I10 Essential (primary) hypertension: Secondary | ICD-10-CM

## 2023-04-11 DIAGNOSIS — G4733 Obstructive sleep apnea (adult) (pediatric): Secondary | ICD-10-CM

## 2023-04-11 DIAGNOSIS — I479 Paroxysmal tachycardia, unspecified: Secondary | ICD-10-CM

## 2023-04-11 NOTE — Patient Instructions (Signed)
Medication Instructions:  Your physician recommends that you continue on your current medications as directed. Please refer to the Current Medication list given to you today.  *If you need a refill on your cardiac medications before your next appointment, please call your pharmacy*   Lab Work: NONE If you have labs (blood work) drawn today and your tests are completely normal, you will receive your results only by: MyChart Message (if you have MyChart) OR A paper copy in the mail If you have any lab test that is abnormal or we need to change your treatment, we will call you to review the results.   Testing/Procedures: Your physician has requested that you have an echocardiogram. Echocardiography is a painless test that uses sound waves to create images of your heart. It provides your doctor with information about the size and shape of your heart and how well your heart's chambers and valves are working. This procedure takes approximately one hour. There are no restrictions for this procedure. Please do NOT wear cologne, perfume, aftershave, or lotions (deodorant is allowed). Please arrive 15 minutes prior to your appointment time.    Follow-Up: At [facility], you and your health needs are our priority.  As part of our continuing mission to provide you with exceptional heart care, we have created designated Provider Care Teams.  These Care Teams include your primary Cardiologist (physician) and Advanced Practice Providers (APPs -  Physician Assistants and Nurse Practitioners) who all work together to provide you with the care you need, when you need it.  We recommend signing up for the patient portal called "MyChart".  Sign up information is provided on this After Visit Summary.  MyChart is used to connect with patients for Virtual Visits (Telemedicine).  Patients are able to view lab/test results, encounter notes, upcoming appointments, etc.  Non-urgent messages can be sent to your  provider as well.   To learn more about what you can do with MyChart, go to [email or web address].    Your next appointment:   6 month(s)  Provider:   Robert Krasowski, MD    Other Instructions   

## 2023-04-11 NOTE — Progress Notes (Signed)
Virtual Visit via Video Note   Because of Tiffany Serrano's co-morbid illnesses, she is at least at moderate risk for complications without adequate follow up.  This format is felt to be most appropriate for this patient at this time.  All issues noted in this document were discussed and addressed.  A limited physical exam was performed with this format.  Please refer to the patient's chart for her consent to telehealth for [facility].     Evaluation Performed:  Follow-up visit  This visit type was conducted due to national recommendations for restrictions regarding the COVID-19 Pandemic (e.g. social distancing).  This format is felt to be most appropriate for this patient at this time.  All issues noted in this document were discussed and addressed.  No physical exam was performed (except for noted visual exam findings with Video Visits).  Please refer to the patient's chart (MyChart message for video visits and phone note for telephone visits) for the patient's consent to telehealth for [facility].  Date:  04/11/2023  ID: Tiffany Serrano, DOB 1996-06-15, MRN [id number]   Patient Location: [address] [place] [address]   Provider location:   [facility] [place] [facility]  PCP:  Lars Mage, NP  Cardiologist:  Gypsy Balsam, MD     Chief Complaint: Doing well  History of Present Illness:    Tiffany Serrano is a 27 y.o. female  who presents via audio/video conferencing for a telehealth visit today.  With past medical history significant for essential hypertension, super morbid obesity, obstructive sleep apnea, polycystic ovarian syndrome, hypothyroidism.  She was referred to me because of tachycardia.  She is supposed to have echocardiogram done but it did not happen because she became sick.  Recently she ended up having abdominal surgery done actually one of the ovary has been removed did well through the surgery.  She was also put on Ozempic SSO is  working with weight reduction clinic and seems to be losing weight seems to feeling better.   The patient does not have symptoms concerning for COVID-19 infection (fever, chills, cough, or new SHORTNESS OF BREATH).    Prior CV studies:   The following studies were reviewed today:       Past Medical History:  Diagnosis Date   Anxiety    Asthma    Back pain    Bilateral swelling of feet    Chronic headaches    Essential hypertension 10/15/2014   Gastroesophageal reflux disease 12/22/2014   Formatting of this note might be different from the original. Last Assessment & Plan: Zantac 150mg  BID prescribed Formatting of this note might be different from the original. Last Assessment & Plan: Zantac 150mg  BID prescribed   History of degenerative disc disease    Hypothyroidism    MDD (major depressive disorder), single episode, severe , no psychosis (HCC) 06/07/2016   Formatting of this note might be different from the original. Last Assessment & Plan: Resolved. Situational 05/2016 with breakup. See GAD tab. Formatting of this note might be different from the original. Last Assessment & Plan: Resolved. Situational 05/2016 with breakup. See GAD tab.   Mild intermittent asthma without complication 01/30/2018   Morbid obesity (HCC)    Obstructive sleep apnea 01/30/2018   PCOS (polycystic ovarian syndrome)    Vitamin D deficiency     Past Surgical History:  Procedure Laterality Date   Right ovariy and cyst removal Right      Current Meds  Medication  Sig   albuterol (PROVENTIL HFA;VENTOLIN HFA) 108 (90 Base) MCG/ACT inhaler Inhale 2 puffs into the lungs every 6 (six) hours as needed for wheezing or shortness of breath.   celecoxib (CELEBREX) 100 MG capsule Take 100 mg by mouth daily.   furosemide (LASIX) 20 MG tablet Take 20 mg by mouth as needed for fluid.   gabapentin (NEURONTIN) 300 MG capsule Take 600 mg by mouth 3 (three) times daily.   hydrOXYzine (ATARAX) 25 MG tablet Take 25  mg by mouth 2 (two) times daily.   metFORMIN (GLUCOPHAGE) 500 MG tablet 1 tab daily with a meal   methocarbamol (ROBAXIN) 500 MG tablet Take 500 mg by mouth 2 (two) times daily.   olmesartan (BENICAR) 20 MG tablet Take 20 mg by mouth daily.   Semaglutide-Weight Management 1 MG/0.5ML SOAJ Inject 1 mg into the skin once a week.      Family History: The patient's family history includes Anxiety disorder in her mother and sister; Arthritis in her maternal grandfather, maternal grandmother, mother, paternal grandfather, and paternal grandmother; Cancer in her father; Depression in her mother; Heart attack in her paternal grandfather; Hyperlipidemia in her mother; Hypertension in her paternal grandfather; Pancreatic cancer in her father; Stroke in her maternal grandmother; Thyroid disease in her mother.   ROS:   Please see the history of present illness.     All other systems reviewed and are negative.   Labs/Other Tests and Data Reviewed:     Recent Labs: 11/28/2022: TSH 2.270 04/03/2023: ALT 21; BUN 12; Creatinine, Ser 0.69; Potassium 4.1; Sodium 142  Recent Lipid Panel    Component Value Date/Time   CHOL 143 12/29/2021 1011   TRIG 99 12/29/2021 1011   HDL 48 12/29/2021 1011   LDLCALC 77 12/29/2021 1011      Exam:    Vital Signs:  Ht 5\' 8"  (1.727 m)   Wt (!) 490 lb (222.3 kg)   BMI 74.50 kg/m     Wt Readings from Last 3 Encounters:  04/11/23 (!) 490 lb (222.3 kg)  04/03/23 (!) 495 lb (224.5 kg)  02/08/23 (!) 524 lb (237.7 kg)     Well nourished, well developed in no acute distress. Alert awake oriented x 3 with talking to the video link not in any distress doing well all the changes finish this  Diagnosis for this visit:   1. Paroxysmal tachycardia (HCC)   2. Essential hypertension   3. Obstructive sleep apnea   4. Morbid obesity with BMI of 60.0-69.9, adult (HCC)      ASSESSMENT & PLAN:    1.  Paroxysmal tachycardia which is sinus tachycardia.  I suspect this  is simply related to her massive weight.  She is supposed to have echocardiogram done to make sure her heart structurally is normal.  It did not happen we will schedule her to have the test done. Essential hypertension blood pressure seems to be reasonably controlled continue present management. 3.  Obstructive sleep apnea on CPAP mask followed by internal medicine team. 4.  Morbid obesity obviously concerning.  She is losing weight on Ozempic and weight reduction clinic should be beneficial to her.  COVID-19 Education: The signs and symptoms of COVID-19 were discussed with the patient and how to seek care for testing (follow up with PCP or arrange E-visit).  The importance of social distancing was discussed today.  Patient Risk:   After full review of this patients clinical status, I feel that they are at least moderate risk  at this time.  Time:   Today, I have spent 15 minutes with the patient with telehealth technology discussing pt health issues.  I spent 15 minutes reviewing her chart before the visit.  Visit was finished at 1:33 PM.    Medication Adjustments/Labs and Tests Ordered: Current medicines are reviewed at length with the patient today.  Concerns regarding medicines are outlined above.  No orders of the defined types were placed in this encounter.  Medication changes: No orders of the defined types were placed in this encounter.    Disposition:  ***  Signed, Georgeanna Lea, MD, [facility] 04/11/2023 1:31 PM    [facility]

## 2023-04-23 ENCOUNTER — Other Ambulatory Visit (INDEPENDENT_AMBULATORY_CARE_PROVIDER_SITE_OTHER): Payer: Self-pay | Admitting: Adult Health

## 2023-05-01 ENCOUNTER — Ambulatory Visit (INDEPENDENT_AMBULATORY_CARE_PROVIDER_SITE_OTHER): Payer: Medicaid Other | Admitting: Adult Health

## 2023-05-01 ENCOUNTER — Encounter (INDEPENDENT_AMBULATORY_CARE_PROVIDER_SITE_OTHER): Payer: Self-pay | Admitting: Adult Health

## 2023-05-01 VITALS — BP 140/84 | HR 95 | Temp 98.1°F | Ht 68.0 in | Wt >= 6400 oz

## 2023-05-01 DIAGNOSIS — E669 Obesity, unspecified: Secondary | ICD-10-CM | POA: Diagnosis not present

## 2023-05-01 DIAGNOSIS — Z6841 Body Mass Index (BMI) 40.0 and over, adult: Secondary | ICD-10-CM

## 2023-05-01 DIAGNOSIS — Z9884 Bariatric surgery status: Secondary | ICD-10-CM

## 2023-05-01 DIAGNOSIS — E559 Vitamin D deficiency, unspecified: Secondary | ICD-10-CM

## 2023-05-01 DIAGNOSIS — R7303 Prediabetes: Secondary | ICD-10-CM | POA: Diagnosis not present

## 2023-05-01 DIAGNOSIS — E66813 Obesity, class 3: Secondary | ICD-10-CM

## 2023-05-01 MED ORDER — METFORMIN HCL 500 MG PO TABS: ORAL_TABLET | ORAL | 0 refills | Status: DC

## 2023-05-01 MED ORDER — SEMAGLUTIDE-WEIGHT MANAGEMENT 1 MG/0.5ML ~~LOC~~ SOAJ: 1.0000 mg | SUBCUTANEOUS | 0 refills | Status: DC

## 2023-05-01 NOTE — Progress Notes (Signed)
WEIGHT SUMMARY AND BIOMETRICS  Vitals Temp: 98.1 F (36.7 C) BP: (!) 140/84 Pulse Rate: 95 SpO2: 96 %   Anthropometric Measurements Height: 5\' 8"  (1.727 m) Weight: (!) 490 lb (222.3 kg) BMI (Calculated): 74.52 Weight at Last Visit: 495 lb Weight Lost Since Last Visit: 5 lb Weight Gained Since Last Visit: 0 Starting Weight: 498 lb Total Weight Loss (lbs): 5 lb (2.268 kg)   No data recorded Other Clinical Data Fasting: no Labs: no Today's Visit #: 10 Starting Date: 12/29/21    Chief Complaint:   OBESITY Tiffany Serrano is here to discuss her progress with her obesity treatment plan. She is on the the Category 4 Plan and states she is following her eating plan approximately 0 % of the time. She states she is exercising Walking 10 minutes 5-6 times per week.   Interim History:  She is scheduled for ERAS LAPAROSCOPIC VERTICAL SLEEVE GASTRECTOMY on 05/14/2023- [facility]  She is currently on a liquid diet- Protein Shakes Sugar Free Jello Sugar Free Popsicles Creamed Soups Water  Exercise-Walking 10 mins daily.   Subjective:   1. Bariatric surgery status She is scheduled for ERAS LAPAROSCOPIC VERTICAL SLEEVE GASTRECTOMY on 05/14/2023- CL[facility]  She is currently on a liquid diet- Protein Shakes Sugar Free Jello Sugar Free Popsicles Creamed Soups Water  She will take 05/05/2023 WEgovy 1mg  dose She will HOLD 11/16/20024 Wegovy 1mg  dose- restart per Surgeon's Recommendation  2. Prediabetes Lab Results  Component Value Date   HGBA1C 5.5 04/03/2023   HGBA1C 5.7 (H) 12/29/2021   HGBA1C 5.6 01/29/2018   She is currently on daily Metformin 500mg  and weekly Wegovy 1mg  Denies mass in neck, dysphagia, dyspepsia, persistent hoarseness, abdominal pain, or N/V/C   Of Note- her husband has indwelling cath. They are unable to engage in sexual intercourse, therefore pregnancy is not a concern  3. Vitamin D deficiency  Latest Reference Range &  Units 12/29/21 10:11 04/03/23 15:13  Vitamin D, 25-Hydroxy 30.0 - 100.0 ng/mL 35.7 23.2 (L)  (L): Data is abnormally low  She is taking OTC Vit D3 5,000 international units daily   Assessment/Plan:   1. Bariatric surgery status Continue nutrition and medication holds per Bariatric Surgeon  2. Prediabetes Refill metFORMIN (GLUCOPHAGE) 500 MG tablet 1 tab daily with a meal Dispense: 30 tablet, Refills: 0 ordered   3. Vitamin D deficiency Continue OTC Vit D3 5,000 international units daily   4. Obesity with current BMI of 84.92 Refill Semaglutide-Weight Management 1 MG/0.5ML SOAJ Inject 1 mg into the skin once a week. Dispense: 2 mL, Refills: 0 ordered   HOLD 05/12/2023 Dos- restart per Bariatric Surgeon  Tiffany Serrano is currently in the action stage of change. As such, her goal is to continue with weight loss efforts. She has agreed to Liquid Diet per [employer]  Exercise goals: All adults should avoid inactivity. Some physical activity is better than none, and adults who participate in any amount of physical activity gain some health benefits. Adults should also include muscle-strengthening activities that involve all major muscle groups on 2 or more days a week.  Behavioral modification strategies: increasing lean protein intake and planning for success.  Tiffany Serrano has agreed to follow-up with our clinic in 4 weeks. She was informed of the importance of frequent follow-up visits to maximize her success with intensive lifestyle modifications for her multiple health conditions.   Objective:   Blood pressure (!) 140/84, pulse 95, temperature 98.1 F (36.7 C), height 5'  8" (1.727 m), weight (!) 490 lb (222.3 kg), last menstrual period 04/15/2023, SpO2 96%. Body mass index is 74.5 kg/m.  General: Cooperative, alert, well developed, in no acute distress. HEENT: Conjunctivae and lids unremarkable. Cardiovascular: Regular rhythm.  Lungs: Normal work of breathing. Neurologic: No focal  deficits.   Lab Results  Component Value Date   CREATININE 0.69 04/03/2023   BUN 12 04/03/2023   NA 142 04/03/2023   K 4.1 04/03/2023   CL 104 04/03/2023   CO2 23 04/03/2023   Lab Results  Component Value Date   ALT 21 04/03/2023   AST 17 04/03/2023   ALKPHOS 87 04/03/2023   BILITOT 0.3 04/03/2023   Lab Results  Component Value Date   HGBA1C 5.5 04/03/2023   HGBA1C 5.7 (H) 12/29/2021   HGBA1C 5.6 01/29/2018   Lab Results  Component Value Date   INSULIN 31.2 (H) 04/03/2023   INSULIN 92.0 (H) 11/28/2022   INSULIN 77.5 (H) 12/29/2021   Lab Results  Component Value Date   TSH 2.270 11/28/2022   Lab Results  Component Value Date   CHOL 143 12/29/2021   HDL 48 12/29/2021   LDLCALC 77 12/29/2021   TRIG 99 12/29/2021   Lab Results  Component Value Date   VD25OH 23.2 (L) 04/03/2023   VD25OH 35.7 12/29/2021   No results found for: "WBC", "HGB", "HCT", "MCV", "PLT" No results found for: "IRON", "TIBC", "FERRITIN"  Attestation Statements:   Reviewed by clinician on day of visit: allergies, medications, problem list, medical history, surgical history, family history, social history, and previous encounter notes.  I have reviewed the above documentation for accuracy and completeness, and I agree with the above. - Dyami Umbach d. Calden Dorsey, NP-C

## 2023-05-06 ENCOUNTER — Encounter (INDEPENDENT_AMBULATORY_CARE_PROVIDER_SITE_OTHER): Payer: Self-pay | Admitting: Adult Health

## 2023-05-23 ENCOUNTER — Ambulatory Visit: Payer: Medicaid Other

## 2023-06-14 ENCOUNTER — Encounter (INDEPENDENT_AMBULATORY_CARE_PROVIDER_SITE_OTHER): Payer: Self-pay | Admitting: Adult Health

## 2023-06-14 ENCOUNTER — Ambulatory Visit (INDEPENDENT_AMBULATORY_CARE_PROVIDER_SITE_OTHER): Payer: Medicaid Other | Admitting: Adult Health

## 2023-06-14 VITALS — BP 105/69 | HR 87 | Temp 97.6°F | Ht 68.0 in | Wt >= 6400 oz

## 2023-06-14 DIAGNOSIS — E669 Obesity, unspecified: Secondary | ICD-10-CM

## 2023-06-14 DIAGNOSIS — Z9884 Bariatric surgery status: Secondary | ICD-10-CM

## 2023-06-14 DIAGNOSIS — R7303 Prediabetes: Secondary | ICD-10-CM

## 2023-06-14 DIAGNOSIS — Z6841 Body Mass Index (BMI) 40.0 and over, adult: Secondary | ICD-10-CM

## 2023-06-14 DIAGNOSIS — E559 Vitamin D deficiency, unspecified: Secondary | ICD-10-CM

## 2023-06-14 DIAGNOSIS — E66813 Obesity, class 3: Secondary | ICD-10-CM

## 2023-06-14 NOTE — Progress Notes (Signed)
WEIGHT SUMMARY AND BIOMETRICS  Vitals Temp: 97.6 F (36.4 C) BP: 105/69 Pulse Rate: 87 SpO2: 93 %   Anthropometric Measurements Height: 5\' 8"  (1.727 m) Weight: (!) 436 lb (197.8 kg) BMI (Calculated): 66.31 Weight at Last Visit: 490lb Weight Lost Since Last Visit: 54lbs Weight Gained Since Last Visit: 0 Starting Weight: 498lb Total Weight Loss (lbs): 59 lb (26.8 kg)   Body Composition  Body Fat %: 65.5 % Fat Mass (lbs): 285.8 lbs Muscle Mass (lbs): 142.8 lbs Visceral Fat Rating : 29   Other Clinical Data Fasting: yes Labs: no Today's Visit #: 11 Starting Date: 12/29/21    Chief Complaint:   OBESITY Tiffany Serrano is here to discuss her progress with her obesity treatment plan. She is on the Bariatric Stage III and states she is following her eating plan approximately 100 % of the time. She states she is not currently exercising.   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 Vitamin D deficiency Tiffany Serrano has started "Patch Aid" - topical Multivitmian patch She denies GI upset at presnt  2. Prediabetes She has been off Wegovy 1 mg since 04/28/2023 She stopped daily Metformin 500mg  on 05/05/2023 She will experience intermittent "sweaty, shakiness".  3. Bariatric surgery  status 05/14/2023 LAP SLEEVE GASTRECTOMY  ERAS LAPAROSCOPIC VERTICAL SLEEVE GASTRECTOMY   Weight 213.7 kg (471 lb 3.2 oz) 05/14/2023 11:00 AM EST    Height 172.7 cm (5\' 8" ) 05/14/2023 11:00 AM EST    Body Mass Index 71.65 05/14/2023 11:00 AM EST     06/14/23  Height 5\' 8"  (1.727 m)  Weight 436 lb (197.8 kg) (H)  BMI (Calculated) 66.31  Total Weight Loss (lbs) 59 lb (26.8 kg)  (H): Data is abnormally high  Assessment/Plan:   1. Vitamin D deficiency Continue "Patch Aid" - topical Multivitmian patch  2. Prediabetes Continue Bariatric Stage III meal plan Carry snacks on her person at all times, ie: peanut butter  3. Bariatric surgery status (Primary) Continue "Patch Aid" - topical Multivitmian patch Continue   4. Obesity with current BMI of 66.31  Tiffany Serrano is currently in the action stage of change. As such, her goal is to continue with weight loss efforts. She has agreed to Bariatric Stage III   Exercise goals:  Daily Walking, NEAT Actitivities   Behavioral modification strategies: increasing lean protein intake, decreasing simple carbohydrates, increasing vegetables, meal planning and cooking strategies, keeping healthy foods in the home, travel eating strategies, holiday eating strategies , celebration eating strategies, and planning for success.  Tiffany Serrano has agreed to follow-up with our clinic in 6 weeks. She was informed of the importance of frequent follow-up visits to maximize her success with intensive lifestyle modifications for her multiple health conditions.   Objective:   Blood pressure 105/69, pulse 87, temperature 97.6 F (36.4  C), height 5\' 8"  (1.727 m), weight (!) 436 lb (197.8 kg), SpO2 93%. Body mass index is 66.29 kg/m.  General: Cooperative, alert, well developed, in no acute distress. HEENT: Conjunctivae and lids unremarkable. Cardiovascular: Regular rhythm.  Lungs: Normal work of breathing. Neurologic: No focal deficits.   Lab Results  Component  Value Date   CREATININE 0.69 04/03/2023   BUN 12 04/03/2023   NA 142 04/03/2023   K 4.1 04/03/2023   CL 104 04/03/2023   CO2 23 04/03/2023   Lab Results  Component Value Date   ALT 21 04/03/2023   AST 17 04/03/2023   ALKPHOS 87 04/03/2023   BILITOT 0.3 04/03/2023   Lab Results  Component Value Date   HGBA1C 5.5 04/03/2023   HGBA1C 5.7 (H) 12/29/2021   HGBA1C 5.6 01/29/2018   Lab Results  Component Value Date   INSULIN 31.2 (H) 04/03/2023   INSULIN 92.0 (H) 11/28/2022   INSULIN 77.5 (H) 12/29/2021   Lab Results  Component Value Date   TSH 2.270 11/28/2022   Lab Results  Component Value Date   CHOL 143 12/29/2021   HDL 48 12/29/2021   LDLCALC 77 12/29/2021   TRIG 99 12/29/2021   Lab Results  Component Value Date   VD25OH 23.2 (L) 04/03/2023   VD25OH 35.7 12/29/2021   No results found for: "WBC", "HGB", "HCT", "MCV", "PLT" No results found for: "IRON", "TIBC", "FERRITIN"  Attestation Statements:   Reviewed by clinician on day of visit: allergies, medications, problem list, medical history, surgical history, family history, social history, and previous encounter notes.  Time spent on visit including pre-visit chart review and post-visit care and charting was 28 minutes.  I have reviewed the above documentation for accuracy and completeness, and I agree with the above. -  Tiffany Serrano d. Tiffany Goin, NP-C

## 2023-07-26 ENCOUNTER — Encounter (INDEPENDENT_AMBULATORY_CARE_PROVIDER_SITE_OTHER): Payer: Self-pay | Admitting: Adult Health

## 2023-07-26 ENCOUNTER — Ambulatory Visit (INDEPENDENT_AMBULATORY_CARE_PROVIDER_SITE_OTHER): Payer: Medicaid Other | Admitting: Adult Health

## 2023-07-26 VITALS — BP 111/74 | HR 77 | Temp 97.8°F | Ht 68.0 in | Wt >= 6400 oz

## 2023-07-26 DIAGNOSIS — Z6841 Body Mass Index (BMI) 40.0 and over, adult: Secondary | ICD-10-CM

## 2023-07-26 DIAGNOSIS — E559 Vitamin D deficiency, unspecified: Secondary | ICD-10-CM | POA: Diagnosis not present

## 2023-07-26 DIAGNOSIS — I10 Essential (primary) hypertension: Secondary | ICD-10-CM | POA: Diagnosis not present

## 2023-07-26 DIAGNOSIS — E669 Obesity, unspecified: Secondary | ICD-10-CM | POA: Diagnosis not present

## 2023-07-26 DIAGNOSIS — Z9884 Bariatric surgery status: Secondary | ICD-10-CM

## 2023-07-26 DIAGNOSIS — R7303 Prediabetes: Secondary | ICD-10-CM

## 2023-07-26 NOTE — Progress Notes (Signed)
WEIGHT SUMMARY AND BIOMETRICS  Vitals Temp: 97.8 F (36.6 C) BP: 111/74 Pulse Rate: 77 SpO2: 97 %   Anthropometric Measurements Height: 5\' 8"  (1.727 m) Weight: (!) 424 lb (192.3 kg) BMI (Calculated): 64.48 Weight at Last Visit: 436lb Weight Lost Since Last Visit: 12lb Weight Gained Since Last Visit: 0 Starting Weight: 498lb Total Weight Loss (lbs): 71 lb (32.2 kg)   Body Composition  Body Fat %: 66 % Fat Mass (lbs): 280.2 lbs Muscle Mass (lbs): 137 lbs Visceral Fat Rating : 28   Other Clinical Data Fasting: yes Labs: no Today's Visit #: 12 Starting Date: 12/29/21    Chief Complaint:   OBESITY Tiffany Serrano is here to discuss her progress with her obesity treatment plan. She is on the Bariatric Stage III and states she is following her eating plan approximately 0 % of the time.  She states she is exercising: NEAT Activities   Interim History:  05/14/2023 LAP SLEEVE GASTRECTOMY  ERAS LAPAROSCOPIC VERTICAL SLEEVE GASTRECTOMY   She has STOPPED ALL OTHER MX MEDS! She is only daily OTC Prilosec 20mg  She has not needed PRN Ventolin > 12 months She will occasionally use OTC Stool Softener- she estimates to have normal BM every other day. Prior to surgery she experienced daily, large BMs  Per pt- she has been advanced to FULL DIET. She has advised to eat tolerance, with emphasis on protein intake (60-90g /day).  Subjective:   1. Hypertension, unspecified type BP at goal at OV She denies CP with exertion She denies tobacco/vape use She has not needed daily Benicar 20mg  and PRN Lasix 20mg  since Nov 2024  2. Vitamin D deficiency  Latest Reference Range & Units 12/29/21 10:11 04/03/23 15:13  Vitamin D, 25-Hydroxy 30.0 - 100.0 ng/mL 35.7 23.2 (L)  (L): Data is abnormally low  Tiffany Serrano now using topical "Patch Aid" multivitamin  3. Prediabetes Lab Results  Component Value Date   HGBA1C 5.5 04/03/2023   HGBA1C 5.7 (H) 12/29/2021   HGBA1C 5.6 01/29/2018     She has been off Wegovy 1mg  since 05/05/2023 Bariatric Surgeon has yet to restart GLP-1 therapy  4. Bariatric surgery status 05/14/2023 LAP SLEEVE GASTRECTOMY  ERAS LAPAROSCOPIC VERTICAL SLEEVE GASTRECTOMY    She is currently following Bariatric Stage III meal plan She estimates to drink 30-64 oz water/day The first several weeks she experienced severe nausea She reports stable GI, lack of upset the last 2 weeks   She was unable to tolerate PO vitamins and now using topical "Patch Aid" multivitamin  Assessment/Plan:   1. Hypertension, unspecified type Monitor BP Eat healthy and increase regular activity/exercise Check Labs  2. Vitamin D deficiency (Primary) Check Labs   3. Prediabetes Check Labs  Eat healthy and increase regular activity/exercise  4. Bariatric surgery status Eat healthy and increase regular activity/exercise  5. Obesity with current BMI of 64.48  Tiffany Serrano is currently in the action stage of change. As such, her goal is to continue with weight loss efforts. She has agreed to Eat to tolerance per Bariatric Surgeon.  Strive for 60-90g protein per day.  Exercise goals: All adults should avoid inactivity. Some physical activity is better than none, and adults who participate in any amount of physical activity gain some health benefits. Adults should also include muscle-strengthening activities that involve all major muscle groups on 2 or more days a week.  Behavioral modification strategies: increasing lean protein intake, decreasing simple carbohydrates, increasing vegetables, increasing water intake, no skipping meals, meal  planning and cooking strategies, keeping healthy foods in the home, ways to avoid boredom eating, and planning for success.  Tiffany Serrano has agreed to follow-up with our clinic in 4 weeks. She was informed of the importance of frequent follow-up visits to maximize her success with intensive lifestyle modifications for her multiple health  conditions.   Review Labs at next OV  Objective:   Blood pressure 111/74, pulse 77, temperature 97.8 F (36.6 C), height 5\' 8"  (1.727 m), weight (!) 424 lb (192.3 kg), last menstrual period 07/01/2023, SpO2 97%. Body mass index is 64.47 kg/m.  General: Cooperative, alert, well developed, in no acute distress. HEENT: Conjunctivae and lids unremarkable. Cardiovascular: Regular rhythm.  Lungs: Normal work of breathing. Neurologic: No focal deficits.   Lab Results  Component Value Date   CREATININE 0.69 04/03/2023   BUN 12 04/03/2023   NA 142 04/03/2023   K 4.1 04/03/2023   CL 104 04/03/2023   CO2 23 04/03/2023   Lab Results  Component Value Date   ALT 21 04/03/2023   AST 17 04/03/2023   ALKPHOS 87 04/03/2023   BILITOT 0.3 04/03/2023   Lab Results  Component Value Date   HGBA1C 5.5 04/03/2023   HGBA1C 5.7 (H) 12/29/2021   HGBA1C 5.6 01/29/2018   Lab Results  Component Value Date   INSULIN 31.2 (H) 04/03/2023   INSULIN 92.0 (H) 11/28/2022   INSULIN 77.5 (H) 12/29/2021   Lab Results  Component Value Date   TSH 2.270 11/28/2022   Lab Results  Component Value Date   CHOL 143 12/29/2021   HDL 48 12/29/2021   LDLCALC 77 12/29/2021   TRIG 99 12/29/2021   Lab Results  Component Value Date   VD25OH 23.2 (L) 04/03/2023   VD25OH 35.7 12/29/2021   No results found for: "WBC", "HGB", "HCT", "MCV", "PLT" No results found for: "IRON", "TIBC", "FERRITIN"  Attestation Statements:   Reviewed by clinician on day of visit: allergies, medications, problem list, medical history, surgical history, family history, social history, and previous encounter notes.  I have reviewed the above documentation for accuracy and completeness, and I agree with the above. -  Diara Chaudhari d. Caroline Matters, NP-C

## 2023-07-28 LAB — VITAMIN B12: Vitamin B-12: 494 pg/mL (ref 232–1245)

## 2023-07-28 LAB — COMPREHENSIVE METABOLIC PANEL
ALT: 40 [IU]/L — ABNORMAL HIGH (ref 0–32)
AST: 40 [IU]/L (ref 0–40)
Albumin: 3.8 g/dL — ABNORMAL LOW (ref 4.0–5.0)
Alkaline Phosphatase: 78 [IU]/L (ref 44–121)
BUN/Creatinine Ratio: 10 (ref 9–23)
BUN: 6 mg/dL (ref 6–20)
Bilirubin Total: 0.4 mg/dL (ref 0.0–1.2)
CO2: 22 mmol/L (ref 20–29)
Calcium: 9.4 mg/dL (ref 8.7–10.2)
Chloride: 104 mmol/L (ref 96–106)
Creatinine, Ser: 0.59 mg/dL (ref 0.57–1.00)
Globulin, Total: 3.2 g/dL (ref 1.5–4.5)
Glucose: 92 mg/dL (ref 70–99)
Potassium: 4.4 mmol/L (ref 3.5–5.2)
Sodium: 142 mmol/L (ref 134–144)
Total Protein: 7 g/dL (ref 6.0–8.5)
eGFR: 125 mL/min/{1.73_m2} (ref >59)

## 2023-07-28 LAB — HEMOGLOBIN A1C
Est. average glucose Bld gHb Est-mCnc: 103 mg/dL
Hgb A1c MFr Bld: 5.2 % (ref 4.8–5.6)

## 2023-07-28 LAB — VITAMIN D 25 HYDROXY (VIT D DEFICIENCY, FRACTURES): Vit D, 25-Hydroxy: 30.9 ng/mL (ref 30.0–100.0)

## 2023-07-28 LAB — INSULIN, RANDOM: INSULIN: 22.6 u[IU]/mL (ref 2.6–24.9)

## 2023-08-21 ENCOUNTER — Encounter (INDEPENDENT_AMBULATORY_CARE_PROVIDER_SITE_OTHER): Payer: Self-pay

## 2023-08-21 ENCOUNTER — Ambulatory Visit (INDEPENDENT_AMBULATORY_CARE_PROVIDER_SITE_OTHER): Payer: Medicaid Other | Admitting: Adult Health

## 2023-08-21 ENCOUNTER — Encounter (INDEPENDENT_AMBULATORY_CARE_PROVIDER_SITE_OTHER): Payer: Self-pay | Admitting: Adult Health

## 2023-08-21 VITALS — BP 122/7 | HR 79 | Temp 98.0°F | Ht 64.0 in | Wt >= 6400 oz

## 2023-08-21 DIAGNOSIS — E669 Obesity, unspecified: Secondary | ICD-10-CM

## 2023-08-21 DIAGNOSIS — Z6841 Body Mass Index (BMI) 40.0 and over, adult: Secondary | ICD-10-CM

## 2023-08-21 DIAGNOSIS — E039 Hypothyroidism, unspecified: Secondary | ICD-10-CM

## 2023-08-21 DIAGNOSIS — I10 Essential (primary) hypertension: Secondary | ICD-10-CM

## 2023-08-21 DIAGNOSIS — R7303 Prediabetes: Secondary | ICD-10-CM

## 2023-08-21 DIAGNOSIS — E559 Vitamin D deficiency, unspecified: Secondary | ICD-10-CM

## 2023-08-21 DIAGNOSIS — Z9884 Bariatric surgery status: Secondary | ICD-10-CM

## 2023-08-21 NOTE — Progress Notes (Signed)
 w    WEIGHT SUMMARY AND BIOMETRICS  Vitals Temp: 98 F (36.7 C) BP: (!) 122/7 Pulse Rate: 79 SpO2: 97 %   Anthropometric Measurements Height: 5\' 4"  (1.626 m) Weight: (!) 415 lb (188.2 kg) BMI (Calculated): 71.2 Weight at Last Visit: 424 lb Weight Lost Since Last Visit: 9 lb Weight Gained Since Last Visit: 0 Starting Weight: 498 lb Total Weight Loss (lbs): 80 lb (36.3 kg)   Body Composition  Body Fat %: 67.3 % Fat Mass (lbs): 279.6 lbs Muscle Mass (lbs): 129 lbs Visceral Fat Rating : 32   Other Clinical Data Fasting: No Labs: No Today's Visit #: 13 Starting Date: 12/29/21    Chief Complaint:   OBESITY Tiffany Serrano is here to discuss her progress with her obesity treatment plan.  She is on the Per pt- she has been advanced to FULL DIET. She has advised to eat tolerance, with emphasis on protein intake (60-90g /day). She states she is exercising Water Aerobics, Walking, and Physical Therapy (PT) 5-30 minutes 3-4 times per week.   Interim History:  She and her sister Tiffany Serrano- will complete the 200 Cycle Challenge during the month March 2025. Cycle Challenge to benefit Alzheimer's [facility]  Her maternal grandmother passed from complication of Alzheimer's   Hunger/appetite-stable appetite Cravings- none Stress- she endorses concern over her husbands declining health and his lack of follow-up with providers Exercise-Water Aerobics and Out patient Physical Therapy (PT) Hydration-she estimates to drink 30-64 oz water/day  Subjective:   1. Hypertension, unspecified type Discussed Labs BP at goal at OV 07/26/2023 CMP: Electrolytes and kidney function stable Slight elevation in ALT She denies RUQ pain She denies recent or frequent Acetaminophen use  2. Bariatric surgery status Discussed Labs 05/14/2023 LAP SLEEVE GASTRECTOMY  ERAS LAPAROSCOPIC VERTICAL SLEEVE GASTRECTOMY    She has STOPPED ALL OTHER MX MEDS! She is only daily OTC Prilosec 20mg  She has not  needed PRN Ventolin > 12 months    Per pt- she has been advanced to FULL DIET. She has advised to eat tolerance, with emphasis on protein intake (60-90g /day).  3. Prediabetes Discussed Labs  Latest Reference Range & Units 07/26/23 10:27  Glucose 70 - 99 mg/dL 92  Hemoglobin W0J 4.8 - 5.6 % 5.2  Est. average glucose Bld gHb Est-mCnc mg/dL 811  INSULIN 2.6 - 91.4 uIU/mL 22.6   She has been off Wegovy 1mg  since 05/05/2023 Bariatric Surgeon has yet to restart GLP-1 therapy CBG and A1c at goal Insulin level greatly improved, however still above goal  4. Vitamin D deficiency Discussed Labs  Latest Reference Range & Units 04/03/23 15:13 07/26/23 10:27  Vitamin D, 25-Hydroxy 30.0 - 100.0 ng/mL 23.2 (L) 30.9  (L): Data is abnormally low  Level is low normal and below goal of 50-70 She is on myriad of vitamin patches that she wears 8 hrs pre day She endorses stable energy levels  5. Hypothyroidism, unspecified type Discussed Labs  Latest Reference Range & Units 01/29/18 11:05 12/29/21 10:11 11/28/22 08:12  TSH 0.450 - 4.500 uIU/mL 2.54 2.650 2.270  Triiodothyronine,Free,Serum 2.3 - 4.2 pg/mL 4.0    Triiodothyronine (T3) 71 - 180 ng/dL  782   N5,AOZH(YQMVHQ) 0.82 - 1.77 ng/dL 4.69 6.29 5.28   She is not on Levothyroxine  Assessment/Plan:   1. Hypertension, unspecified type (Primary) Limit Na+ Continue regular exercise  2. Bariatric surgery status F/u with specialist as directed. Inquire about restarting Wegovy therapy  3. Prediabetes Continue healthy eating and regular exercise Inquire safety of  restarting GLP-1 therapy from Bariatric surgeon  4. Vitamin D deficiency Continue topical supplementation  5. Hypothyroidism, unspecified type Monitor Labs  6. Obesity with current BMI of 71.3  Tiffany Serrano is currently in the action stage of change. As such, her goal is to continue with weight loss efforts. She has agreed to Bariatric Eating Plan 1200 cal with at least 89 g  protein/day.   Exercise goals: All adults should avoid inactivity. Some physical activity is better than none, and adults who participate in any amount of physical activity gain some health benefits. Adults should also include muscle-strengthening activities that involve all major muscle groups on 2 or more days a week.  Behavioral modification strategies: increasing lean protein intake, decreasing simple carbohydrates, increasing vegetables, no skipping meals, meal planning and cooking strategies, keeping healthy foods in the home, and planning for success.  Tiffany Serrano has agreed to follow-up with our clinic in 4 weeks. She was informed of the importance of frequent follow-up visits to maximize her success with intensive lifestyle modifications for her multiple health conditions.   Objective:   Blood pressure (!) 122/7, pulse 79, temperature 98 F (36.7 C), height 5\' 4"  (1.626 m), weight (!) 415 lb (188.2 kg), last menstrual period 07/01/2023, SpO2 97%. Body mass index is 71.23 kg/m.  General: Cooperative, alert, well developed, in no acute distress. HEENT: Conjunctivae and lids unremarkable. Cardiovascular: Regular rhythm.  Lungs: Normal work of breathing. Neurologic: No focal deficits.   Lab Results  Component Value Date   CREATININE 0.59 07/26/2023   BUN 6 07/26/2023   NA 142 07/26/2023   K 4.4 07/26/2023   CL 104 07/26/2023   CO2 22 07/26/2023   Lab Results  Component Value Date   ALT 40 (H) 07/26/2023   AST 40 07/26/2023   ALKPHOS 78 07/26/2023   BILITOT 0.4 07/26/2023   Lab Results  Component Value Date   HGBA1C 5.2 07/26/2023   HGBA1C 5.5 04/03/2023   HGBA1C 5.7 (H) 12/29/2021   HGBA1C 5.6 01/29/2018   Lab Results  Component Value Date   INSULIN 22.6 07/26/2023   INSULIN 31.2 (H) 04/03/2023   INSULIN 92.0 (H) 11/28/2022   INSULIN 77.5 (H) 12/29/2021   Lab Results  Component Value Date   TSH 2.270 11/28/2022   Lab Results  Component Value Date   CHOL 143  12/29/2021   HDL 48 12/29/2021   LDLCALC 77 12/29/2021   TRIG 99 12/29/2021   Lab Results  Component Value Date   VD25OH 30.9 07/26/2023   VD25OH 23.2 (L) 04/03/2023   VD25OH 35.7 12/29/2021   No results found for: "WBC", "HGB", "HCT", "MCV", "PLT" No results found for: "IRON", "TIBC", "FERRITIN"  Attestation Statements:   Reviewed by clinician on day of visit: allergies, medications, problem list, medical history, surgical history, family history, social history, and previous encounter notes.  I have reviewed the above documentation for accuracy and completeness, and I agree with the above. -  Alinna Siple d. Derra Shartzer, NP-C

## 2023-08-22 ENCOUNTER — Telehealth (INDEPENDENT_AMBULATORY_CARE_PROVIDER_SITE_OTHER): Payer: Self-pay

## 2023-08-22 ENCOUNTER — Encounter (INDEPENDENT_AMBULATORY_CARE_PROVIDER_SITE_OTHER): Payer: Self-pay | Admitting: Adult Health

## 2023-08-22 ENCOUNTER — Other Ambulatory Visit (INDEPENDENT_AMBULATORY_CARE_PROVIDER_SITE_OTHER): Payer: Self-pay | Admitting: Adult Health

## 2023-08-22 MED ORDER — WEGOVY 0.25 MG/0.5ML ~~LOC~~ SOAJ: 0.2500 mg | SUBCUTANEOUS | 0 refills | Status: DC

## 2023-08-22 NOTE — Telephone Encounter (Signed)
 PA for Wegovy 0.25 has been submitted, awaiting PA questions.

## 2023-08-23 ENCOUNTER — Ambulatory Visit (INDEPENDENT_AMBULATORY_CARE_PROVIDER_SITE_OTHER): Payer: Medicaid Other | Admitting: Adult Health

## 2023-08-23 NOTE — Telephone Encounter (Signed)
 PA for Wegovy 0.25 has been approved. PA is now complete.

## 2023-09-19 ENCOUNTER — Encounter (INDEPENDENT_AMBULATORY_CARE_PROVIDER_SITE_OTHER): Payer: Self-pay | Admitting: Adult Health

## 2023-09-19 ENCOUNTER — Ambulatory Visit (INDEPENDENT_AMBULATORY_CARE_PROVIDER_SITE_OTHER): Payer: Medicaid Other | Admitting: Adult Health

## 2023-09-19 VITALS — BP 95/66 | HR 129 | Temp 97.6°F | Ht 64.0 in | Wt >= 6400 oz

## 2023-09-19 DIAGNOSIS — E559 Vitamin D deficiency, unspecified: Secondary | ICD-10-CM | POA: Diagnosis not present

## 2023-09-19 DIAGNOSIS — Z6841 Body Mass Index (BMI) 40.0 and over, adult: Secondary | ICD-10-CM

## 2023-09-19 DIAGNOSIS — R7303 Prediabetes: Secondary | ICD-10-CM | POA: Diagnosis not present

## 2023-09-19 DIAGNOSIS — E039 Hypothyroidism, unspecified: Secondary | ICD-10-CM | POA: Diagnosis not present

## 2023-09-19 DIAGNOSIS — E669 Obesity, unspecified: Secondary | ICD-10-CM | POA: Diagnosis not present

## 2023-09-19 DIAGNOSIS — E66813 Obesity, class 3: Secondary | ICD-10-CM

## 2023-09-19 MED ORDER — WEGOVY 0.5 MG/0.5ML ~~LOC~~ SOAJ: 0.5000 mg | SUBCUTANEOUS | 0 refills | Status: DC

## 2023-09-19 NOTE — Progress Notes (Signed)
 WEIGHT SUMMARY AND BIOMETRICS  Vitals Temp: 97.6 F (36.4 C) BP: 95/66 Pulse Rate: (!) 129 SpO2: 98 %   Anthropometric Measurements Height: 5\' 4"  (1.626 m) Weight: (!) 401 lb (181.9 kg) BMI (Calculated): 68.8 Weight at Last Visit: 415 lb Weight Lost Since Last Visit: 14 lb Starting Weight: 498 lb Total Weight Loss (lbs): 97 lb (44 kg)   Body Composition  Body Fat %: 66.3 % Fat Mass (lbs): 266 lbs Muscle Mass (lbs): 128.4 lbs Visceral Fat Rating : 30   Other Clinical Data Fasting: no Labs: no Today's Visit #: 14 Starting Date: 12/29/21    Chief Complaint:   OBESITY Tiffany Serrano is here to discuss her progress with her obesity treatment plan.  She is on the keeping a food journal and adhering to recommended goals of 1200-1300 calories and 60g+ protein and states she is following her eating plan approximately 100 % of the time.  She states she is exercising Walking 5-10 minutes 7 times per week.   Interim History:  05/14/2023 LAP SLEEVE GASTRECTOMY  ERAS LAPAROSCOPIC VERTICAL SLEEVE GASTRECTOMY    Bariatric Surgeon approved restarting BJYNWG. First dose on [facility]/2025[facility] 0.25mg  She has had 4 doses of the loading dose. Denies mass in neck, dysphagia, dyspepsia, persistent hoarseness, abdominal pain, or N/V/C      Per pt- she has been advanced to FULL DIET. She has advised to eat tolerance, with emphasis on protein intake (60-90g /day).  Hydration-she estimates to drink 50-60 oz water/day  Of note- She was provided oral birth control via PCP She believes it is Tri-Lo She endorses 100% safe sex practices, ie: condom use  She and her husband may move to [place], [place] April-May 2025  Subjective:   1. Vitamin D deficiency  Latest Reference Range & Units 07/26/23 10:27  Vitamin D, 25-Hydroxy 30.0 - 100.0 ng/mL 30.9   She is on daily Bariatric MVI  2. Hypothyroidism, unspecified type  Latest Reference Range & Units 01/29/18 11:05 12/29/21 10:11  11/28/22 08:12  TSH 0.450 - 4.500 uIU/mL 2.54 2.650 2.270  Triiodothyronine,Free,Serum 2.3 - 4.2 pg/mL 4.0    Triiodothyronine (T3) 71 - 180 ng/dL  956   O1,HYQM(VHQION) 0.82 - 1.77 ng/dL 6.29 5.28 4.13   She endorses stable energy levels She is not on Levothyroxine therapy  3. Prediabetes Lab Results  Component Value Date   HGBA1C 5.2 07/26/2023   HGBA1C 5.5 04/03/2023   HGBA1C 5.7 (H) 12/29/2021    Latest Reference Range & Units 11/28/22 08:12 04/03/23 15:13 07/26/23 10:27  INSULIN 2.6 - 24.9 uIU/mL 92.0 (H) 31.2 (H) 22.6  (H): Data is abnormally high  Bariatric Surgeon approved restarting Wegovy. First dose on [facility]/20[facility] 0.25mg  She has had 4 doses of the loading dose. Denies mass in neck, dysphagia, dyspepsia, persistent hoarseness, abdominal pain, or N/V/C   Assessment/Plan:   1. Vitamin D deficiency (Primary) Continue daily Bariatric MVI  2. Hypothyroidism, unspecified type Monitor Labs  3. Prediabetes Continue daily Bariatric MVI Continue healthy eating and regular walking Continue weekly GLP-1 therapy  4. Obesity with current BMI of 68.9 Refill and INCREASE []   Semaglutide-Weight Management (WEGOVY) 0.5 MG/0.5ML SOAJ Inject 0.5 mg into the skin once a week. Dispense: 2 mL, Refills: 0 ordered   Tiffany Serrano is currently in the action stage of change. As such, her goal is to continue with weight loss efforts. She has agreed to keeping a food journal and adhering to recommended goals of 1200-1300 calories and 60g+ protein.  Exercise goals: All adults should avoid inactivity. Some physical activity is better than none, and adults who participate in any amount of physical activity gain some health benefits. Adults should also include muscle-strengthening activities that involve all major muscle groups on 2 or more days a week.  Behavioral modification strategies: increasing lean protein intake, decreasing simple carbohydrates, increasing vegetables, increasing water  intake, decreasing eating out, no skipping meals, meal planning and cooking strategies, keeping healthy foods in the home, and planning for success.  [name] has agreed to follow-up with our clinic in 4 weeks. She was informed of the importance of frequent follow-up visits to maximize her success with intensive lifestyle modifications for her multiple health conditions.   Objective:   Blood pressure 95/66, pulse (!) 129, temperature 97.6 F (36.4 C), height 5\' 4"  (1.626 m), weight (!) 401 lb (181.9 kg), SpO2 98%. Body mass index is 68.83 kg/m.  General: Cooperative, alert, well developed, in no acute distress. HEENT: Conjunctivae and lids unremarkable. Cardiovascular: Regular rhythm.  Lungs: Normal work of breathing. Neurologic: No focal deficits.   Lab Results  Component Value Date   CREATININE 0.59 07/26/2023   BUN 6 07/26/2023   NA 142 07/26/2023   K 4.4 07/26/2023   CL 104 07/26/2023   CO2 22 07/26/2023   Lab Results  Component Value Date   ALT 40 (H) 07/26/2023   AST 40 07/26/2023   ALKPHOS 78 07/26/2023   BILITOT 0.4 07/26/2023   Lab Results  Component Value Date   HGBA1C 5.2 07/26/2023   HGBA1C 5.5 04/03/2023   HGBA1C 5.7 (H) 12/29/2021   HGBA1C 5.6 01/29/2018   Lab Results  Component Value Date   INSULIN 22.6 07/26/2023   INSULIN 31.2 (H) 04/03/2023   INSULIN 92.0 (H) 11/28/2022   INSULIN 77.5 (H) 12/29/2021   Lab Results  Component Value Date   TSH 2.270 11/28/2022   Lab Results  Component Value Date   CHOL 143 12/29/2021   HDL 48 12/29/2021   LDLCALC 77 12/29/2021   TRIG 99 12/29/2021   Lab Results  Component Value Date   VD25OH 30.9 07/26/2023   VD25OH 23.2 (L) 04/03/2023   VD25OH 35.7 12/29/2021   No results found for: "WBC", "HGB", "HCT", "MCV", "PLT" No results found for: "IRON", "TIBC", "FERRITIN"  Attestation Statements:   Reviewed by clinician on day of visit: allergies, medications, problem list, medical history, surgical history,  family history, social history, and previous encounter notes.  I have reviewed the above documentation for accuracy and completeness, and I agree with the above. -  Thomes Burak d. Elivia Robotham, NP-C

## 2023-10-11 ENCOUNTER — Other Ambulatory Visit (INDEPENDENT_AMBULATORY_CARE_PROVIDER_SITE_OTHER): Payer: Self-pay | Admitting: Adult Health

## 2023-10-11 MED ORDER — WEGOVY 0.5 MG/0.5ML ~~LOC~~ SOAJ: 0.5000 mg | SUBCUTANEOUS | 0 refills | Status: DC

## 2023-10-11 NOTE — Telephone Encounter (Signed)
 LAST APPOINTMENT DATE: 09/19/2023 NEXT APPOINTMENT DATE: 10/30/2023   [facility] - [place], [place] - [address] [place] [id number] Phone: [phone] Fax: [phone]  Patient is requesting a refill of the following medications: Requested Prescriptions   Pending Prescriptions Disp Refills   Semaglutide-Weight Management (WEGOVY) 0.5 MG/0.5ML SOAJ 2 mL 0    Sig: Inject 0.5 mg into the skin once a week.    Date last filled: 09/19/2023 Previously prescribed by Napoleon Backer, NP  Lab Results  Component Value Date   HGBA1C 5.2 07/26/2023   HGBA1C 5.5 04/03/2023   HGBA1C 5.7 (H) 12/29/2021   Lab Results  Component Value Date   LDLCALC 77 12/29/2021   CREATININE 0.59 07/26/2023   Lab Results  Component Value Date   VD25OH 30.9 07/26/2023   VD25OH 23.2 (L) 04/03/2023   VD25OH 35.7 12/29/2021    BP Readings from Last 3 Encounters:  09/19/23 95/66  08/21/23 (!) 122/7  07/26/23 111/74

## 2023-10-30 ENCOUNTER — Encounter (INDEPENDENT_AMBULATORY_CARE_PROVIDER_SITE_OTHER): Payer: Self-pay | Admitting: Adult Health

## 2023-10-30 ENCOUNTER — Ambulatory Visit (INDEPENDENT_AMBULATORY_CARE_PROVIDER_SITE_OTHER): Admitting: Adult Health

## 2023-10-30 VITALS — BP 133/75 | HR 99 | Temp 98.0°F | Ht 64.0 in | Wt 386.0 lb

## 2023-10-30 DIAGNOSIS — Z6841 Body Mass Index (BMI) 40.0 and over, adult: Secondary | ICD-10-CM

## 2023-10-30 DIAGNOSIS — E669 Obesity, unspecified: Secondary | ICD-10-CM

## 2023-10-30 DIAGNOSIS — E039 Hypothyroidism, unspecified: Secondary | ICD-10-CM

## 2023-10-30 DIAGNOSIS — I10 Essential (primary) hypertension: Secondary | ICD-10-CM

## 2023-10-30 DIAGNOSIS — R7303 Prediabetes: Secondary | ICD-10-CM | POA: Diagnosis not present

## 2023-10-30 DIAGNOSIS — E66813 Obesity, class 3: Secondary | ICD-10-CM

## 2023-10-30 MED ORDER — WEGOVY 0.5 MG/0.5ML ~~LOC~~ SOAJ: 0.5000 mg | SUBCUTANEOUS | 0 refills | Status: DC

## 2023-10-30 NOTE — Progress Notes (Signed)
 WEIGHT SUMMARY AND BIOMETRICS  Vitals Temp: 98 F (36.7 C) BP: 133/75 Pulse Rate: 99 SpO2: 96 %   Anthropometric Measurements Height: 5\' 4"  (1.626 m) Weight: (!) 386 lb (175.1 kg) BMI (Calculated): 66.22 Weight at Last Visit: 401 lb Weight Lost Since Last Visit: 15 lb Weight Gained Since Last Visit: 0 Starting Weight: 498 lb Total Weight Loss (lbs): 112 lb (50.8 kg)   Body Composition  Body Fat %: 64.7 % Fat Mass (lbs): 250.2 lbs Muscle Mass (lbs): 129.8 lbs Visceral Fat Rating : 29   Other Clinical Data Fasting: no Labs: no Today's Visit #: 15 Starting Date: 12/29/21    Chief Complaint:   OBESITY Tiffany Serrano is here to discuss her progress with her obesity treatment plan.  She is on the keeping a food journal and adhering to recommended goals of 1200-1300 calories and 70-90 protein and states she is following her eating plan approximately 90 % of the time.  She states she is exercising Walking 5-10 minutes 7 times per week.  Interim History:  Reviewed Bioimpedance results with pt: Muscle Mass: +1.4 lbs Adipose Mass: -15.8 lbs  She has recently starting meal planning/prepping with a friend. This same friend, will also start joining her at [employer]: aim for 3 x weekly  Subjective:   1. Hypothyroidism, unspecified type  Latest Reference Range & Units 01/29/18 11:05 12/29/21 10:11 11/28/22 08:12  TSH 0.450 - 4.500 uIU/mL 2.54 2.650 2.270  Triiodothyronine,Free,Serum 2.3 - 4.2 pg/mL 4.0    Triiodothyronine (T3) 71 - 180 ng/dL  161   W9,UEAV(WUJWJX) 0.82 - 1.77 ng/dL 9.14 7.82 9.56   She endorses stable energy levels She is not on Levothyroxine therapy  2. Prediabetes Lab Results  Component Value Date   HGBA1C 5.2 07/26/2023   HGBA1C 5.5 04/03/2023   HGBA1C 5.7 (H) 12/29/2021    Bariatric Surgeon approved restarting Wegovy . First dose on 08/24/2023- Wegovy  0.25mg  She has had 4 doses of the loading dose. Wegovy  increased from 0.25mg  to 0.5mg   on/about 09/19/2023 Denies mass in neck, dysphagia, dyspepsia, persistent hoarseness, abdominal pain, or N/V/C   3. Hypertension, unspecified type BP slightly elevated at OV She denies CP when walking She is not currently on any anti diabetic therapy  Assessment/Plan:   1. Hypothyroidism, unspecified type (Primary) Monitor Labs  2. Prediabetes Continue healthy eating and regular exercise Continue weekly Wegovy  0.5mg   3. Hypertension, unspecified type Limit Na+ Continue meal planning/prepping Continue regular exericse  4. Obesity with current BMI of 66.4  [name] is currently in the action stage of change. As such, her goal is to continue with weight loss efforts. She has agreed to keeping a food journal and adhering to recommended goals of 1200-1300 calories and 70-90 protein.   Exercise goals: For substantial health benefits, adults should do at least 150 minutes (2 hours and 30 minutes) a week of moderate-intensity, or 75 minutes (1 hour and 15 minutes) a week of vigorous-intensity aerobic physical activity, or an equivalent combination of moderate- and vigorous-intensity aerobic activity. Aerobic activity should be performed in episodes of at least 10 minutes, and preferably, it should be spread throughout the week.  Behavioral modification strategies: increasing lean protein intake, decreasing simple carbohydrates, increasing vegetables, increasing water intake, no skipping meals, meal planning and cooking strategies, keeping healthy foods in the home, ways to avoid boredom eating, and planning for success.  Tiffany Serrano has agreed to follow-up with our clinic in 4 weeks. She was informed of the importance of frequent  follow-up visits to maximize her success with intensive lifestyle modifications for her multiple health conditions.   Objective:   Blood pressure 133/75, pulse 99, temperature 98 F (36.7 C), height 5\' 4"  (1.626 m), weight (!) 386 lb (175.1 kg), SpO2 96%. Body mass index  is 66.26 kg/m.  General: Cooperative, alert, well developed, in no acute distress. HEENT: Conjunctivae and lids unremarkable. Cardiovascular: Regular rhythm.  Lungs: Normal work of breathing. Neurologic: No focal deficits.   Lab Results  Component Value Date   CREATININE 0.59 07/26/2023   BUN 6 07/26/2023   NA 142 07/26/2023   K 4.4 07/26/2023   CL 104 07/26/2023   CO2 22 07/26/2023   Lab Results  Component Value Date   ALT 40 (H) 07/26/2023   AST 40 07/26/2023   ALKPHOS 78 07/26/2023   BILITOT 0.4 07/26/2023   Lab Results  Component Value Date   HGBA1C 5.2 07/26/2023   HGBA1C 5.5 04/03/2023   HGBA1C 5.7 (H) 12/29/2021   HGBA1C 5.6 01/29/2018   Lab Results  Component Value Date   INSULIN  22.6 07/26/2023   INSULIN  31.2 (H) 04/03/2023   INSULIN  92.0 (H) 11/28/2022   INSULIN  77.5 (H) 12/29/2021   Lab Results  Component Value Date   TSH 2.270 11/28/2022   Lab Results  Component Value Date   CHOL 143 12/29/2021   HDL 48 12/29/2021   LDLCALC 77 12/29/2021   TRIG 99 12/29/2021   Lab Results  Component Value Date   VD25OH 30.9 07/26/2023   VD25OH 23.2 (L) 04/03/2023   VD25OH 35.7 12/29/2021   No results found for: "WBC", "HGB", "HCT", "MCV", "PLT" No results found for: "IRON", "TIBC", "FERRITIN"  Attestation Statements:   Reviewed by clinician on day of visit: allergies, medications, problem list, medical history, surgical history, family history, social history, and previous encounter notes.  I have reviewed the above documentation for accuracy and completeness, and I agree with the above. -  Rainey Kahrs d. Marranda Arakelian, NP-C

## 2023-11-27 ENCOUNTER — Other Ambulatory Visit (INDEPENDENT_AMBULATORY_CARE_PROVIDER_SITE_OTHER): Payer: Self-pay | Admitting: Adult Health

## 2023-11-28 ENCOUNTER — Encounter (INDEPENDENT_AMBULATORY_CARE_PROVIDER_SITE_OTHER): Payer: Self-pay | Admitting: Adult Health

## 2023-11-28 ENCOUNTER — Other Ambulatory Visit (INDEPENDENT_AMBULATORY_CARE_PROVIDER_SITE_OTHER): Payer: Self-pay

## 2023-11-28 DIAGNOSIS — E66813 Obesity, class 3: Secondary | ICD-10-CM

## 2023-11-28 MED ORDER — WEGOVY 0.5 MG/0.5ML ~~LOC~~ SOAJ: 0.5000 mg | SUBCUTANEOUS | 0 refills | Status: DC

## 2023-12-10 ENCOUNTER — Telehealth (INDEPENDENT_AMBULATORY_CARE_PROVIDER_SITE_OTHER): Admitting: Adult Health

## 2023-12-10 ENCOUNTER — Encounter (INDEPENDENT_AMBULATORY_CARE_PROVIDER_SITE_OTHER): Payer: Self-pay | Admitting: Adult Health

## 2023-12-10 DIAGNOSIS — E559 Vitamin D deficiency, unspecified: Secondary | ICD-10-CM

## 2023-12-10 DIAGNOSIS — E669 Obesity, unspecified: Secondary | ICD-10-CM

## 2023-12-10 DIAGNOSIS — E039 Hypothyroidism, unspecified: Secondary | ICD-10-CM | POA: Diagnosis not present

## 2023-12-10 DIAGNOSIS — Z9884 Bariatric surgery status: Secondary | ICD-10-CM

## 2023-12-10 DIAGNOSIS — Z6841 Body Mass Index (BMI) 40.0 and over, adult: Secondary | ICD-10-CM

## 2023-12-10 DIAGNOSIS — I10 Essential (primary) hypertension: Secondary | ICD-10-CM

## 2023-12-10 DIAGNOSIS — R7303 Prediabetes: Secondary | ICD-10-CM

## 2023-12-10 MED ORDER — WEGOVY 0.5 MG/0.5ML ~~LOC~~ SOAJ: 0.5000 mg | SUBCUTANEOUS | 1 refills | Status: AC

## 2023-12-10 NOTE — Progress Notes (Signed)
 WEIGHT SUMMARY AND BIOMETRICS  No data recorded Anthropometric Measurements Starting Weight: 498 lb   No data recorded Other Clinical Data Today's Visit #: 16 Starting Date: 12/29/21 Comments: Video visit today    Chief Complaint:  I connected with  Tiffany Serrano on 12/10/23 by a video and audio enabled telemedicine application and verified that I am speaking with the correct person using two identifiers.  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 The patient expressed understanding and agreed to proceed.  OBESITY Tiffany Serrano is here to discuss her progress with her obesity treatment plan.  She is on the keeping a food journal and adhering to recommended goals of 1200-1400 calories and 80-100g protein and states she is following her eating plan approximately 0 % of the time.  She states she is exercising Walking 30 minutes 7 times per week.   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  Stress- training for new job and planning on move to [employer] [place]  next month [employer]- multiple bariatric centers in [place], [place]  Exercise-she has been walking daily, cumulatively will equal 3mins/day  Hydration-she estimates to drink 45-50 oz water/day  Subjective:   1. Hypothyroidism, unspecified type  Latest Reference Range & Units 01/29/18 11:05 12/29/21 10:11 11/28/22 08:12  TSH 0.450 - 4.500 uIU/mL 2.54 2.650 2.270   She endorses stable energy levels  2. Prediabetes Lab Results  Component Value Date   HGBA1C 5.2 07/26/2023   HGBA1C 5.5 04/03/2023   HGBA1C 5.7 (H) 12/29/2021     Latest Reference Range & Units 11/28/22 08:12 04/03/23 15:13 07/26/23 10:27  INSULIN  2.6 - 24.9  uIU/mL 92.0 (H) 31.2 (H) 22.6  (H): Data is abnormally high  She is currently on weekly Wegovy  0.5mg  Denies mass in neck, dysphagia, dyspepsia, persistent hoarseness, abdominal pain, or N/V/C   3. Vitamin D  deficiency She endorses stable energy levels She reports compliance with Bariatric MVI  4. Hypertension, unspecified type BP well controlled She is not on any antihypertensive therapy at present  5. Bariatric surgery status 11/13/2023 [facility] OV Notes-  S/P laparoscopic sleeve gastrectomy (Primary   Assessment & Plan   1. S/P laparoscopic sleeve gastrectomy  2. Class 3 severe obesity due to excess calories with body mass index (BMI) of 60.0 to 69.9 in adult, unspecified whether serious comorbidity present  3. Encounter for dietary counseling and surveillance   Dietary/Lifestyle Targets to optimize weight loss/stability: HPLFLC diet: -Intake approximately 1200-1400cal/day with 80-100g of protein -Handout provided with protein rich meal/snack options -To avoid high carb/starch items like bread/pasta/grains -Keeping sugars to single digits -Plan for FU with nutrition as needed to help with optimized intake  Exercise Goals: -Frequency: 5 days/wk for at least 30-60 minutes/session of cardio,  -Frequency: 2 days/wk for at least 30 minutes/session of resistance exercises -Recommend stretching before and after exercise  Sleep -Goal of 7hrs of sleep/night  -Encouraged CPAP compliance  -May consider Melatonin or Mg+ for sleep aid -Sleep hygiene reviewed  Additional concerns -hair loss - pt made aware this can be common post bariatric sx. Encouraged to increase protein intake above 60g daily, continue bariatric MVI, and may add biotin. Hair loss h/o reviewed and provided in AVS. Will continue to  monitor.   Behavior: -follow up with behavioral health if further support is needed  Medication -Discontinue Actigall -Can consider weaning PPI to daily or completely if  not already done so. Monitor for sxs of reflux/heartburn/epigastric pain -Continue bowel regimen PRN -Continue MVI/Calcium + Vitamin D   Assessment/Plan:   1. Hypothyroidism, unspecified type (Primary) Monitor Labs  2. Prediabetes Continue healthy eating and regular walking  3. Vitamin D  deficiency Monitor Labs  4. Hypertension, unspecified type Continue healthy eating and regular walking  5. Bariatric surgery status F/u with [facility] as directed  6. Current BMI 66.22 Refill  Semaglutide -Weight Management (WEGOVY ) 0.5 MG/0.5ML SOAJ Inject 0.5 mg into the skin once a week. Dispense: 2 mL, Refills: 1 ordered   Tiffany Serrano is currently in the action stage of change. As such, her goal is to continue with weight loss efforts. She has agreed to keeping a food journal and adhering to recommended goals of 1200-1400 calories and 80-100g protein.   Exercise goals: All adults should avoid inactivity. Some physical activity is better than none, and adults who participate in any amount of physical activity gain some health benefits. Adults should also include muscle-strengthening activities that involve all major muscle groups on 2 or more days a week.  Behavioral modification strategies: increasing lean protein intake, decreasing simple carbohydrates, increasing vegetables, increasing water intake, no skipping meals, meal planning and cooking strategies, keeping healthy foods in the home, ways to avoid boredom eating, and planning for success.  Tiffany Serrano has agreed to follow-up with our clinic in 4 weeks. She was informed of the importance of frequent follow-up visits to maximize her success with intensive lifestyle modifications for her multiple health conditions.   Check Fasting Labs at next OV  Objective:   There were no vitals taken for this visit. There is no height or weight on file to calculate BMI.  General: Cooperative, alert, well developed, in no acute distress. HEENT: Conjunctivae and  lids unremarkable. Cardiovascular: Regular rhythm.  Lungs: Normal work of breathing. Neurologic: No focal deficits.   Lab Results  Component Value Date   CREATININE 0.59 07/26/2023   BUN 6 07/26/2023   NA 142 07/26/2023   K 4.4 07/26/2023   CL 104 07/26/2023   CO2 22 07/26/2023   Lab Results  Component Value Date   ALT 40 (H) 07/26/2023   AST 40 07/26/2023   ALKPHOS 78 07/26/2023   BILITOT 0.4 07/26/2023   Lab Results  Component Value Date   HGBA1C 5.2 07/26/2023   HGBA1C 5.5 04/03/2023   HGBA1C 5.7 (H) 12/29/2021   HGBA1C 5.6 01/29/2018   Lab Results  Component Value Date   INSULIN  22.6 07/26/2023   INSULIN  31.2 (H) 04/03/2023   INSULIN  92.0 (H) 11/28/2022   INSULIN  77.5 (H) 12/29/2021   Lab Results  Component Value Date   TSH 2.270 11/28/2022   Lab Results  Component Value Date   CHOL 143 12/29/2021   HDL 48 12/29/2021   LDLCALC 77 12/29/2021   TRIG 99 12/29/2021   Lab Results  Component Value Date   VD25OH 30.9 07/26/2023   VD25OH 23.2 (L) 04/03/2023   VD25OH 35.7 12/29/2021   No results found for: WBC, HGB, HCT, MCV, PLT No results found for: IRON, TIBC, FERRITIN   Attestation Statements:   Reviewed by clinician on day of visit: allergies, medications, problem list, medical history, surgical history, family history, social history, and previous encounter notes.  I have reviewed the above documentation for accuracy and completeness, and I agree  with the above. -  Lyanna Blystone d. Gwyn Hieronymus, NP-C
